# Patient Record
Sex: Female | Born: 1980
Health system: Southern US, Community
[De-identification: ages and names within clinical notes are randomized; demographics above are authoritative.]

## PROBLEM LIST (undated history)

## (undated) DIAGNOSIS — Z789 Other specified health status: Secondary | ICD-10-CM

---
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
## 2004-09-12 ENCOUNTER — Emergency Department (HOSPITAL_COMMUNITY): Admission: EM | Admit: 2004-09-12 | Discharge: 2004-09-12 | Payer: Self-pay | Admitting: Emergency Medicine

## 2005-05-31 ENCOUNTER — Other Ambulatory Visit: Admission: RE | Admit: 2005-05-31 | Discharge: 2005-05-31 | Payer: Self-pay | Admitting: Obstetrics and Gynecology

## 2006-06-03 IMAGING — CT CT HEAD W/O CM
1 series · 16 of 30 positions shown, 20 images · non-contrast
Comparison: None.

CLINICAL DATA: Three hours history of occipital headaches and dizziness.

CRANIAL CT WITHOUT CONTRAST  09/12/2004:
TECHNIQUE: 5 mm axial images were obtained from the skull base through the brain
to the vertex.

[Series 2: head_seq 4.5 h42s st · axial · 0.43mm/px · z∈[+1300,+1426]mm · 16 of 32 slices shown, 20 images]
[im 2/32  brain]
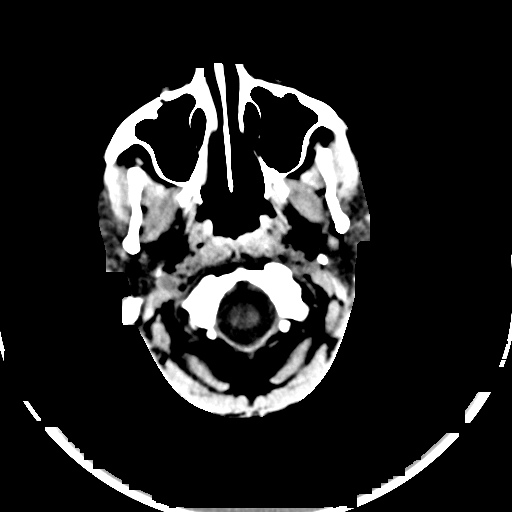
[im 2/32  bone]
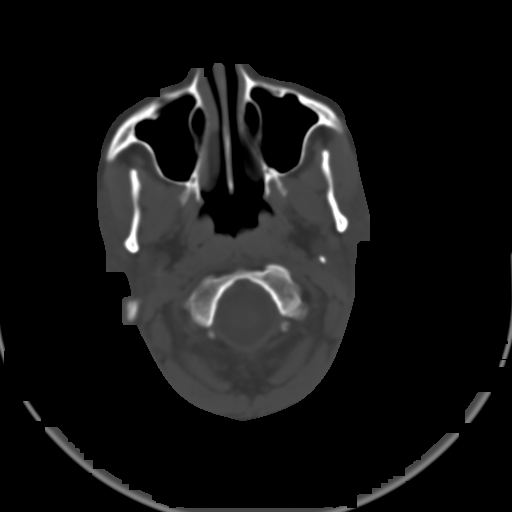
[im 4/32  brain]
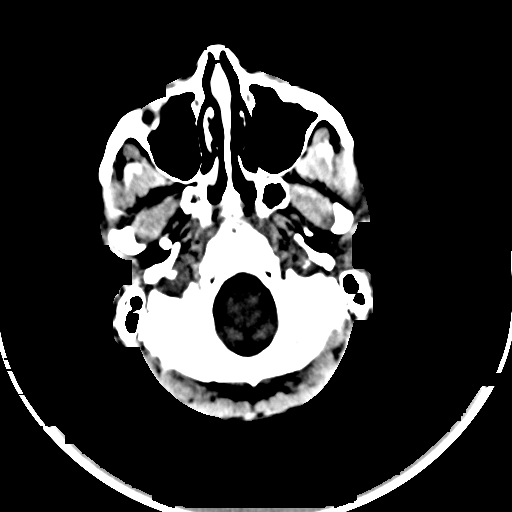
[im 6/32  brain]
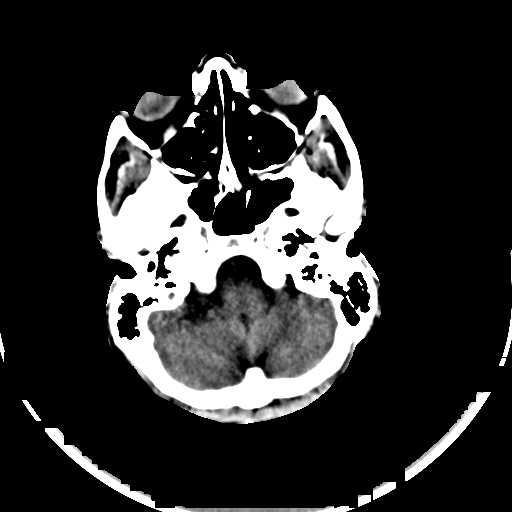
[im 8/32  brain]
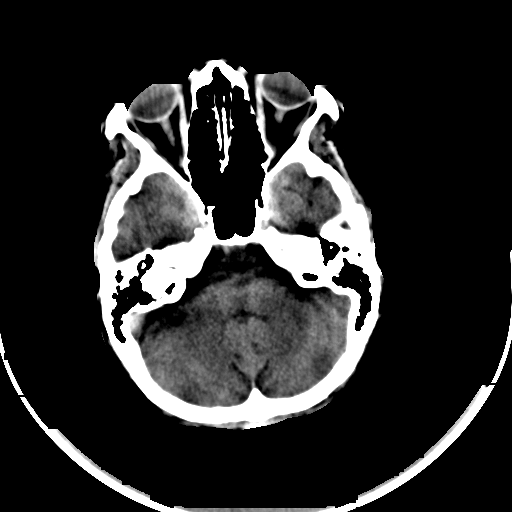
[im 9/32  brain]
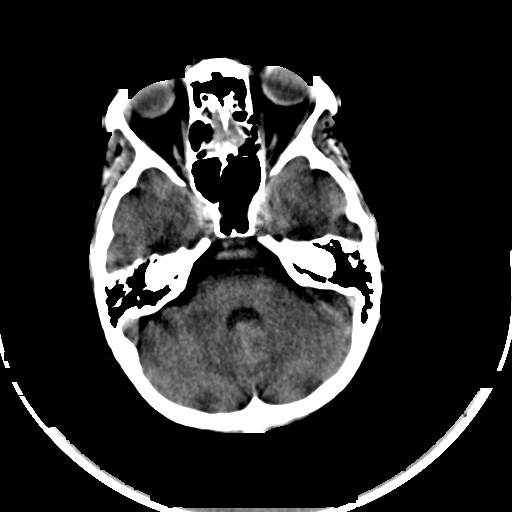
[im 9/32  bone]
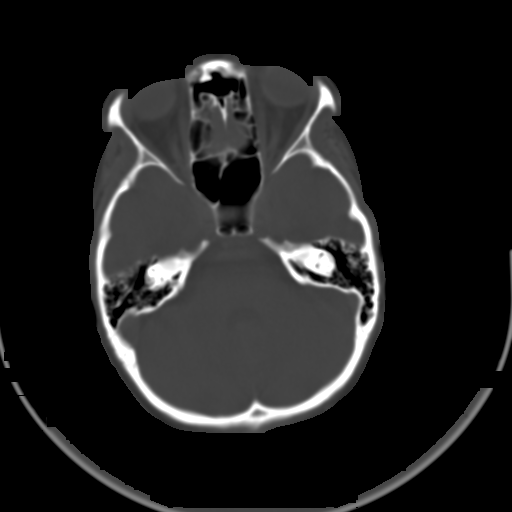
[im 11/32  brain]
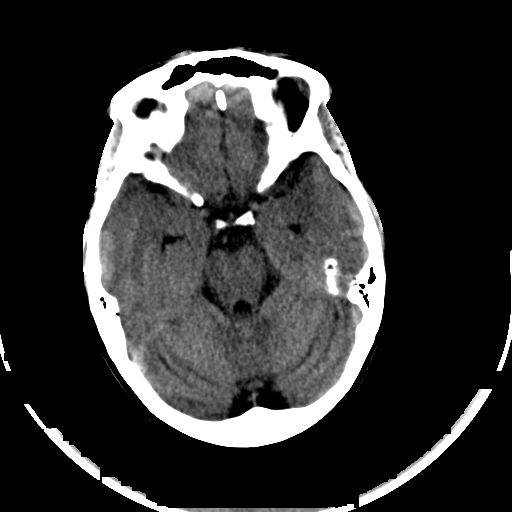
[im 13/32  brain]
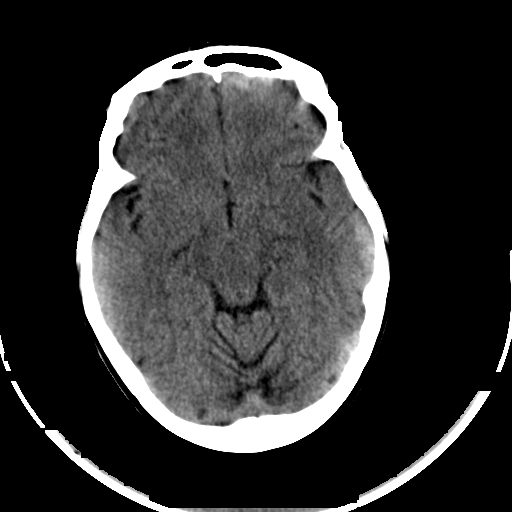
[im 15/32  brain]
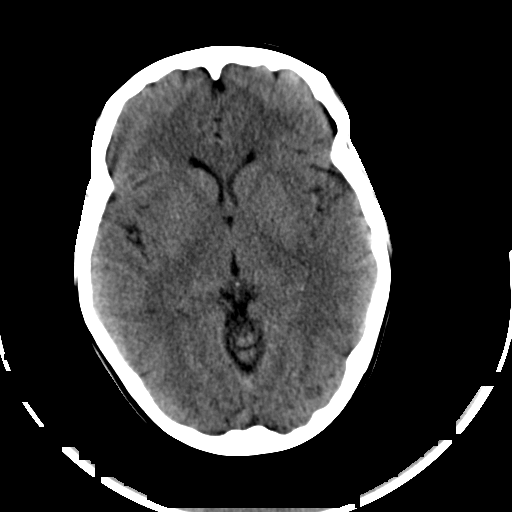
[im 17/32  brain]
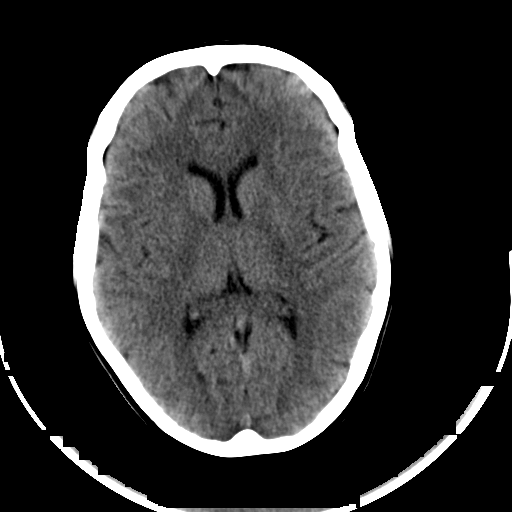
[im 17/32  bone]
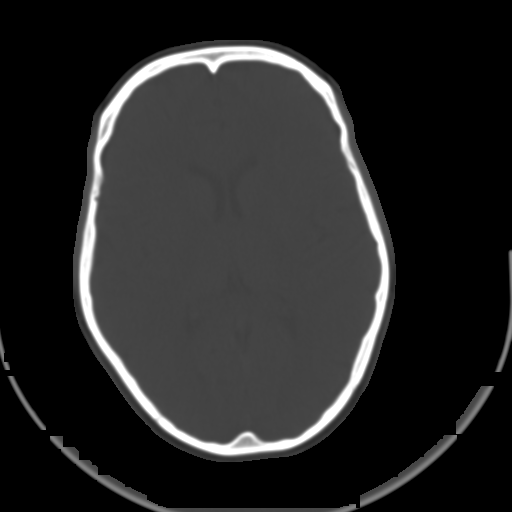
[im 19/32  brain]
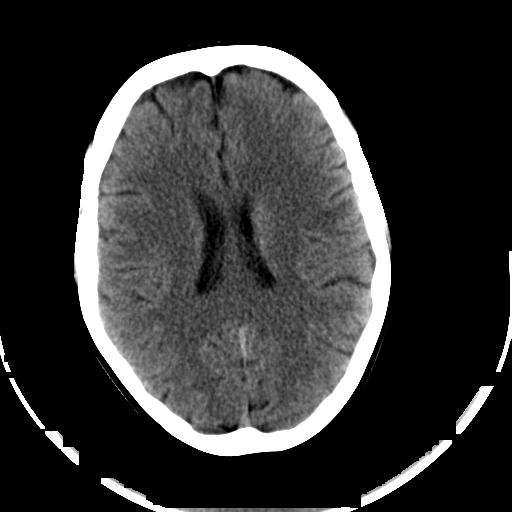
[im 21/32  brain]
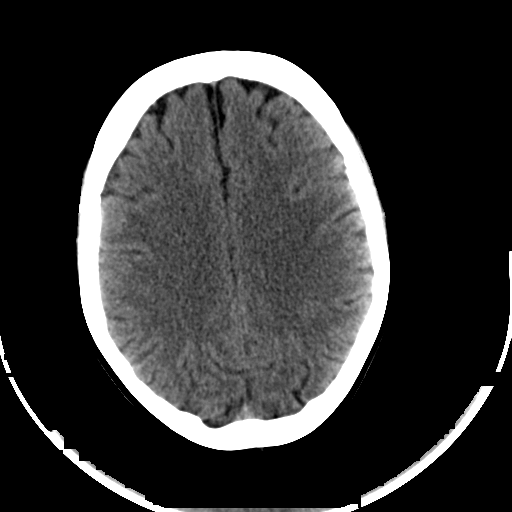
[im 23/32  brain]
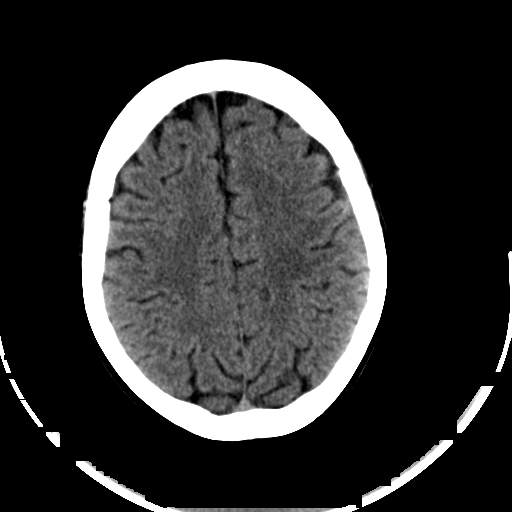
[im 24/32  brain]
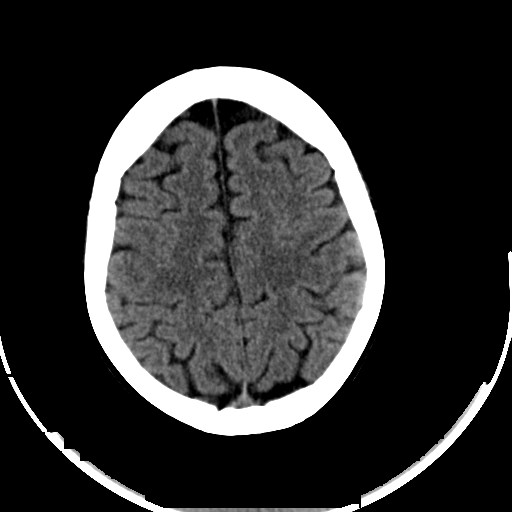
[im 24/32  bone]
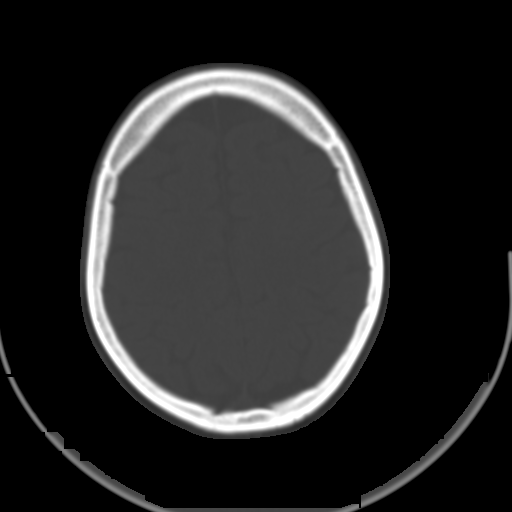
[im 26/32  brain]
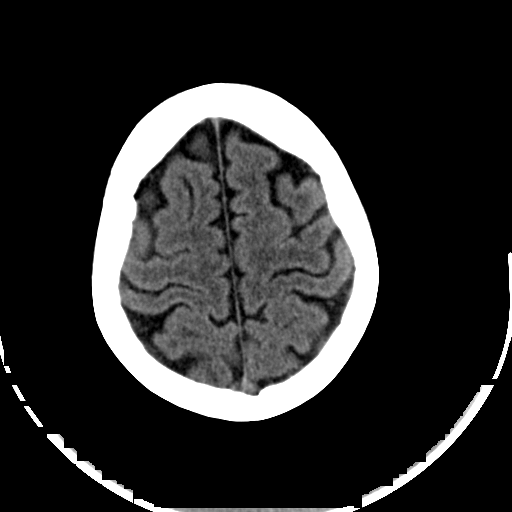
[im 28/32  brain]
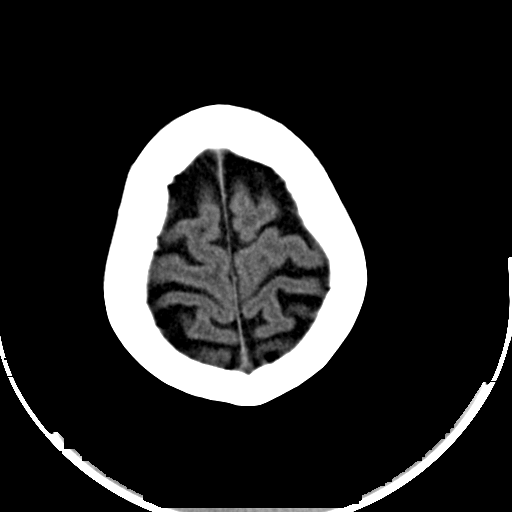
[im 30/32  brain]
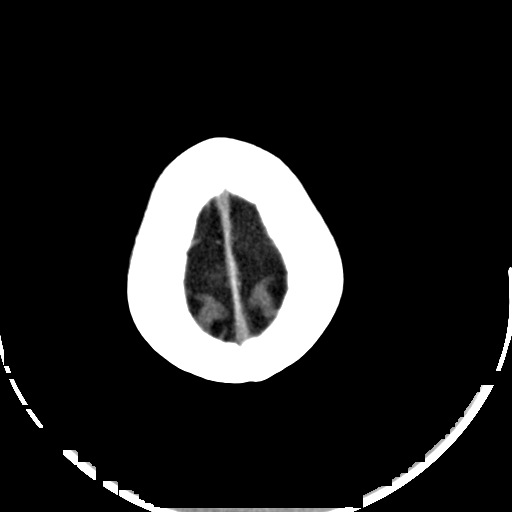

[16 of 30 positions shown; findings below may reference images not displayed]

FINDINGS: The ventricular system is normal in size and appearance for age. There
is no mass-effect or midline shift. There is no hemorrhage or hematoma. No
extra-axial fluid collections are identified. I see no focal brain parenchymal
abnormalities. 

The bone window images demonstrate no osseous abnormalities involving the skull.
The visualized paranasal sinuses and the mastoid air cells appear well aerated.
IMPRESSION: Normal unenhanced cranial CT.

## 2008-03-24 ENCOUNTER — Emergency Department (HOSPITAL_COMMUNITY): Admission: EM | Admit: 2008-03-24 | Discharge: 2008-03-24 | Payer: Self-pay | Admitting: Emergency Medicine

## 2009-02-14 HISTORY — PX: OTHER SURGICAL HISTORY: SHX169

## 2009-04-02 DIAGNOSIS — R87610 Atypical squamous cells of undetermined significance on cytologic smear of cervix (ASC-US): Secondary | ICD-10-CM | POA: Insufficient documentation

## 2009-10-01 ENCOUNTER — Inpatient Hospital Stay (HOSPITAL_COMMUNITY): Admission: AD | Admit: 2009-10-01 | Discharge: 2009-10-01 | Payer: Self-pay | Admitting: Obstetrics and Gynecology

## 2009-10-02 ENCOUNTER — Inpatient Hospital Stay (HOSPITAL_COMMUNITY): Admission: AD | Admit: 2009-10-02 | Discharge: 2009-10-02 | Payer: Self-pay | Admitting: Obstetrics and Gynecology

## 2009-10-02 ENCOUNTER — Ambulatory Visit: Payer: Self-pay | Admitting: Obstetrics and Gynecology

## 2009-10-11 ENCOUNTER — Inpatient Hospital Stay (HOSPITAL_COMMUNITY): Admission: AD | Admit: 2009-10-11 | Discharge: 2009-10-14 | Payer: Self-pay | Admitting: Obstetrics and Gynecology

## 2010-04-30 LAB — CBC
HCT: 42.7 % (ref 36.0–46.0)
Hemoglobin: 11.4 g/dL — ABNORMAL LOW (ref 12.0–15.0)
Hemoglobin: 14.2 g/dL (ref 12.0–15.0)
Hemoglobin: 15 g/dL (ref 12.0–15.0)
MCH: 32.4 pg (ref 26.0–34.0)
MCH: 32.6 pg (ref 26.0–34.0)
MCH: 33.3 pg (ref 26.0–34.0)
MCHC: 33.4 g/dL (ref 30.0–36.0)
MCHC: 34.2 g/dL (ref 30.0–36.0)
MCV: 95.2 fL (ref 78.0–100.0)
MCV: 96.1 fL (ref 78.0–100.0)
Platelets: 242 10*3/uL (ref 150–400)
Platelets: 256 10*3/uL (ref 150–400)
RBC: 3.42 MIL/uL — ABNORMAL LOW (ref 3.87–5.11)
RBC: 4.36 MIL/uL (ref 3.87–5.11)
RBC: 4.63 MIL/uL (ref 3.87–5.11)
RDW: 12.7 % (ref 11.5–15.5)
RDW: 13 % (ref 11.5–15.5)
WBC: 15.4 10*3/uL — ABNORMAL HIGH (ref 4.0–10.5)
WBC: 9.4 10*3/uL (ref 4.0–10.5)

## 2010-04-30 LAB — COMPREHENSIVE METABOLIC PANEL
ALT: 24 U/L (ref 0–35)
ALT: 29 U/L (ref 0–35)
AST: 32 U/L (ref 0–37)
AST: 36 U/L (ref 0–37)
Albumin: 2.1 g/dL — ABNORMAL LOW (ref 3.5–5.2)
Albumin: 3.4 g/dL — ABNORMAL LOW (ref 3.5–5.2)
Alkaline Phosphatase: 113 U/L (ref 39–117)
Alkaline Phosphatase: 169 U/L — ABNORMAL HIGH (ref 39–117)
BUN: 10 mg/dL (ref 6–23)
Calcium: 8.2 mg/dL — ABNORMAL LOW (ref 8.4–10.5)
Calcium: 9.2 mg/dL (ref 8.4–10.5)
Calcium: 9.3 mg/dL (ref 8.4–10.5)
Chloride: 107 mEq/L (ref 96–112)
Creatinine, Ser: 0.75 mg/dL (ref 0.4–1.2)
Creatinine, Ser: 0.8 mg/dL (ref 0.4–1.2)
GFR calc non Af Amer: >60 mL/min (ref >60)
GFR calc non Af Amer: >60 mL/min (ref >60)
Glucose, Bld: 82 mg/dL (ref 70–99)
Sodium: 134 mEq/L — ABNORMAL LOW (ref 135–145)
Sodium: 137 mEq/L (ref 135–145)
Total Bilirubin: 0.3 mg/dL (ref 0.3–1.2)
Total Bilirubin: 0.6 mg/dL (ref 0.3–1.2)
Total Protein: 4.3 g/dL — ABNORMAL LOW (ref 6.0–8.3)
Total Protein: 6.7 g/dL (ref 6.0–8.3)

## 2010-04-30 LAB — DIFFERENTIAL
Lymphocytes Relative: 10 % — ABNORMAL LOW (ref 12–46)
Monocytes Relative: 2 % — ABNORMAL LOW (ref 3–12)
Neutro Abs: 13.5 10*3/uL — ABNORMAL HIGH (ref 1.7–7.7)

## 2010-04-30 LAB — URINALYSIS, ROUTINE W REFLEX MICROSCOPIC
Hgb urine dipstick: NEGATIVE
pH: 6.5 (ref 5.0–8.0)

## 2010-04-30 LAB — URIC ACID
Uric Acid, Serum: 4.4 mg/dL (ref 2.4–7.0)
Uric Acid, Serum: 5.2 mg/dL (ref 2.4–7.0)
Uric Acid, Serum: 5.4 mg/dL (ref 2.4–7.0)

## 2010-12-27 ENCOUNTER — Emergency Department (HOSPITAL_COMMUNITY)
Admission: EM | Admit: 2010-12-27 | Discharge: 2010-12-27 | Disposition: A | Discharge status: Home / Self Care | Payer: 59 | Source: Home / Self Care | Attending: Emergency Medicine | Admitting: Emergency Medicine

## 2010-12-27 ENCOUNTER — Encounter (HOSPITAL_COMMUNITY): Payer: Self-pay

## 2010-12-27 DIAGNOSIS — J069 Acute upper respiratory infection, unspecified: Secondary | ICD-10-CM

## 2010-12-27 DIAGNOSIS — J329 Chronic sinusitis, unspecified: Secondary | ICD-10-CM

## 2010-12-27 MED ORDER — AMOXICILLIN-POT CLAVULANATE 500-125 MG PO TABS: 1.0000 | ORAL_TABLET | Freq: Three times a day (TID) | ORAL | Status: AC

## 2010-12-27 NOTE — ED Notes (Signed)
sinus pressure, congestion, with scratchy throat. yellowish-greenish mucous.  started saturday. gotten worse since then.  pt. is still breast-feeding, only takes allegra.

## 2010-12-27 NOTE — ED Provider Notes (Addendum)
History     CSN: [id number] Arrival date & time: 12/27/2010 12:51 PM   First MD Initiated Contact with Patient 12/27/10 1231      Chief Complaint  Patient presents with  . Sinusitis    sinus pressure, congestion, with scratchy throat. yellowish-greenish mucous.  started saturday. gotten worse since then.  pt. is still breast-feeding, only takes allegra.     (Consider location/radiation/quality/duration/timing/severity/associated sxs/prior treatment) HPI Comments: Now with pressure of my sinuses, and green stuff coming out of my nose, with sinus pressure, fevers and a sore throat  Patient is a 30 y.o. female presenting with sinusitis. The history is provided by the patient.  Sinusitis  The current episode started more than 2 days ago. The problem has not changed since onset.The maximum temperature recorded prior to her arrival was 100 to 100.9 F. The pain is at a severity of 4/10. The pain is moderate. The pain has been constant since onset. Associated symptoms include congestion, ear pain, sinus pressure, sore throat and cough. Pertinent negatives include no shortness of breath. She has tried nothing for the symptoms.    History reviewed. No pertinent past medical history.  Past Surgical History  Procedure Date  . Cesarian 2011    Family History  Problem Relation Age of Onset  . Hypertension Father     History  Substance Use Topics  . Smoking status: Never Smoker   . Smokeless tobacco: Never Used  . Alcohol Use: No    OB History    Grav Para Term Preterm Abortions TAB SAB Ect Mult Living                  Review of Systems  Constitutional: Negative.   HENT: Positive for ear pain, congestion, sore throat, rhinorrhea, postnasal drip and sinus pressure.   Eyes: Negative.   Respiratory: Positive for cough. Negative for shortness of breath, wheezing and stridor.   All other systems reviewed and are negative.    Allergies  Doxycycline and Sulfa antibiotics  Home  Medications   Current Outpatient Rx  Name Route Sig Dispense Refill  . LORATADINE 10 MG PO TABS Oral Take 10 mg by mouth daily.      . AMOXICILLIN-POT CLAVULANATE 500-125 MG PO TABS Oral Take 1 tablet (500 mg total) by mouth every 8 (eight) hours. 21 tablet 0    BP 131/72  Pulse 76  Temp(Src) 98.6 F (37 C) (Oral)  Resp 100  SpO2 100%  Physical Exam  Vitals reviewed. Constitutional: She appears well-developed and well-nourished.  HENT:  Head: Normocephalic.  Right Ear: Hearing, tympanic membrane, external ear and ear canal normal.  Left Ear: Hearing, tympanic membrane, external ear and ear canal normal.  Nose: Mucosal edema and rhinorrhea present.  Mouth/Throat: Mucous membranes are normal. Posterior oropharyngeal edema and posterior oropharyngeal erythema present. No tonsillar abscesses.  Eyes: Pupils are equal, round, and reactive to light.  Neck: Normal range of motion.  Pulmonary/Chest: Effort normal and breath sounds normal.  Neurological: She is alert.  Skin: Skin is warm.    ED Course  Procedures (including critical care time)  Labs Reviewed - No data to display No results found.   1. Acute URI   2. Sinusitis       MDM  Infant at home with URI        Jimmie Molly, MD 12/27/10 1534  Jimmie Molly, MD 12/27/10 1536

## 2011-02-18 ENCOUNTER — Ambulatory Visit
Admission: RE | Admit: 2011-02-18 | Discharge: 2011-02-18 | Disposition: A | Discharge status: Home / Self Care | Payer: 59 | Source: Ambulatory Visit | Attending: Family Medicine | Admitting: Family Medicine

## 2011-02-18 ENCOUNTER — Other Ambulatory Visit: Payer: Self-pay | Admitting: Family Medicine

## 2011-02-18 DIAGNOSIS — M79609 Pain in unspecified limb: Secondary | ICD-10-CM

## 2013-02-05 LAB — OB RESULTS CONSOLE GC/CHLAMYDIA
CHLAMYDIA, DNA PROBE: NEGATIVE
GC PROBE AMP, GENITAL: NEGATIVE

## 2013-02-05 LAB — OB RESULTS CONSOLE RPR: RPR: NONREACTIVE

## 2013-02-05 LAB — OB RESULTS CONSOLE HIV ANTIBODY (ROUTINE TESTING): HIV: NONREACTIVE

## 2013-02-05 LAB — OB RESULTS CONSOLE HEPATITIS B SURFACE ANTIGEN: Hepatitis B Surface Ag: NEGATIVE

## 2013-02-05 LAB — OB RESULTS CONSOLE RUBELLA ANTIBODY, IGM: RUBELLA: IMMUNE

## 2013-02-05 LAB — OB RESULTS CONSOLE ANTIBODY SCREEN: Antibody Screen: NEGATIVE

## 2013-02-05 LAB — OB RESULTS CONSOLE ABO/RH: RH Type: POSITIVE

## 2013-08-19 LAB — OB RESULTS CONSOLE GBS: STREP GROUP B AG: NEGATIVE

## 2013-09-09 ENCOUNTER — Encounter (HOSPITAL_COMMUNITY): Payer: Self-pay | Admitting: Pharmacist

## 2013-09-11 ENCOUNTER — Inpatient Hospital Stay (HOSPITAL_COMMUNITY)
Admission: AD | Admit: 2013-09-11 | Discharge: 2013-09-13 | DRG: 775 | Disposition: A | Discharge status: Home / Self Care | Payer: 59 | Source: Ambulatory Visit | Attending: Obstetrics and Gynecology | Admitting: Obstetrics and Gynecology

## 2013-09-11 ENCOUNTER — Inpatient Hospital Stay (HOSPITAL_COMMUNITY): Payer: 59 | Admitting: Anesthesiology

## 2013-09-11 ENCOUNTER — Encounter (HOSPITAL_COMMUNITY): Payer: 59 | Admitting: Anesthesiology

## 2013-09-11 ENCOUNTER — Encounter (HOSPITAL_COMMUNITY): Payer: Self-pay | Admitting: *Deleted

## 2013-09-11 DIAGNOSIS — O212 Late vomiting of pregnancy: Secondary | ICD-10-CM | POA: Diagnosis present

## 2013-09-11 DIAGNOSIS — O34219 Maternal care for unspecified type scar from previous cesarean delivery: Secondary | ICD-10-CM | POA: Diagnosis present

## 2013-09-11 DIAGNOSIS — Z8249 Family history of ischemic heart disease and other diseases of the circulatory system: Secondary | ICD-10-CM

## 2013-09-11 HISTORY — DX: Other specified health status: Z78.9

## 2013-09-11 LAB — CBC
HEMATOCRIT: 36.3 % (ref 36.0–46.0)
Hemoglobin: 12.3 g/dL (ref 12.0–15.0)
MCH: 31.3 pg (ref 26.0–34.0)
MCHC: 33.9 g/dL (ref 30.0–36.0)
MCV: 92.4 fL (ref 78.0–100.0)
Platelets: 258 10*3/uL (ref 150–400)
RBC: 3.93 MIL/uL (ref 3.87–5.11)
RDW: 13 % (ref 11.5–15.5)
WBC: 7.1 10*3/uL (ref 4.0–10.5)

## 2013-09-11 LAB — TYPE AND SCREEN
ABO/RH(D): A POS
ANTIBODY SCREEN: NEGATIVE

## 2013-09-11 MED ORDER — FLEET ENEMA 7-19 GM/118ML RE ENEM: 1.0000 | ENEMA | RECTAL | Status: DC | PRN

## 2013-09-11 MED ORDER — ONDANSETRON HCL 4 MG/2ML IJ SOLN
4.0000 mg | Freq: Four times a day (QID) | INTRAMUSCULAR | Status: DC | PRN
  Administered 2013-09-11 – 2013-09-12 (×2): 4 mg via INTRAVENOUS
  Filled 2013-09-11 (×3): qty 2

## 2013-09-11 MED ORDER — EPHEDRINE 5 MG/ML INJ
10.0000 mg | INTRAVENOUS | Status: DC | PRN
  Filled 2013-09-11: qty 2

## 2013-09-11 MED ORDER — ACETAMINOPHEN 325 MG PO TABS: 650.0000 mg | ORAL_TABLET | ORAL | Status: DC | PRN

## 2013-09-11 MED ORDER — FENTANYL 2.5 MCG/ML BUPIVACAINE 1/10 % EPIDURAL INFUSION (WH - ANES)
INTRAMUSCULAR | Status: DC | PRN
  Administered 2013-09-11: 14 mL/h via EPIDURAL

## 2013-09-11 MED ORDER — OXYTOCIN 40 UNITS IN LACTATED RINGERS INFUSION - SIMPLE MED
62.5000 mL/h | INTRAVENOUS | Status: DC
  Administered 2013-09-12: 999 mL/h via INTRAVENOUS
  Filled 2013-09-11: qty 1000

## 2013-09-11 MED ORDER — OXYTOCIN BOLUS FROM INFUSION: 500.0000 mL | INTRAVENOUS | Status: DC

## 2013-09-11 MED ORDER — CITRIC ACID-SODIUM CITRATE 334-500 MG/5ML PO SOLN: 30.0000 mL | ORAL | Status: DC | PRN

## 2013-09-11 MED ORDER — PHENYLEPHRINE 40 MCG/ML (10ML) SYRINGE FOR IV PUSH (FOR BLOOD PRESSURE SUPPORT)
PREFILLED_SYRINGE | INTRAVENOUS | Status: DC
Start: 2013-09-11 — End: 2013-09-12
  Filled 2013-09-11: qty 10

## 2013-09-11 MED ORDER — DIPHENHYDRAMINE HCL 50 MG/ML IJ SOLN: 12.5000 mg | INTRAMUSCULAR | Status: DC | PRN

## 2013-09-11 MED ORDER — FENTANYL 2.5 MCG/ML BUPIVACAINE 1/10 % EPIDURAL INFUSION (WH - ANES)
INTRAMUSCULAR | Status: AC
  Filled 2013-09-11: qty 125

## 2013-09-11 MED ORDER — LACTATED RINGERS IV SOLN
500.0000 mL | INTRAVENOUS | Status: DC | PRN
  Administered 2013-09-11: 500 mL via INTRAVENOUS

## 2013-09-11 MED ORDER — LIDOCAINE HCL (PF) 1 % IJ SOLN
INTRAMUSCULAR | Status: DC | PRN
  Administered 2013-09-11: 3 mL
  Administered 2013-09-11 (×2): 5 mL

## 2013-09-11 MED ORDER — LACTATED RINGERS IV SOLN
INTRAVENOUS | Status: DC
  Administered 2013-09-11 – 2013-09-12 (×3): via INTRAVENOUS

## 2013-09-11 MED ORDER — IBUPROFEN 600 MG PO TABS: 600.0000 mg | ORAL_TABLET | Freq: Four times a day (QID) | ORAL | Status: DC | PRN

## 2013-09-11 MED ORDER — FENTANYL 2.5 MCG/ML BUPIVACAINE 1/10 % EPIDURAL INFUSION (WH - ANES)
14.0000 mL/h | INTRAMUSCULAR | Status: DC | PRN
  Administered 2013-09-12: 14 mL/h via EPIDURAL
  Filled 2013-09-11: qty 125

## 2013-09-11 MED ORDER — LACTATED RINGERS IV SOLN
500.0000 mL | Freq: Once | INTRAVENOUS | Status: AC
  Administered 2013-09-11: 500 mL via INTRAVENOUS

## 2013-09-11 MED ORDER — LIDOCAINE HCL (PF) 1 % IJ SOLN
30.0000 mL | INTRAMUSCULAR | Status: DC | PRN
  Filled 2013-09-11: qty 30

## 2013-09-11 MED ORDER — PHENYLEPHRINE 40 MCG/ML (10ML) SYRINGE FOR IV PUSH (FOR BLOOD PRESSURE SUPPORT)
80.0000 ug | PREFILLED_SYRINGE | INTRAVENOUS | Status: DC | PRN
  Filled 2013-09-11: qty 2

## 2013-09-11 MED ORDER — OXYCODONE-ACETAMINOPHEN 5-325 MG PO TABS: 1.0000 | ORAL_TABLET | ORAL | Status: DC | PRN

## 2013-09-11 NOTE — Anesthesia Procedure Notes (Signed)
Epidural Patient location during procedure: OB  Staffing Anesthesiologist: Dmoni Fortson Performed by: anesthesiologist   Preanesthetic Checklist Completed: patient identified, site marked, surgical consent, pre-op evaluation, timeout performed, IV checked, risks and benefits discussed and monitors and equipment checked  Epidural Patient position: sitting Prep: ChloraPrep Patient monitoring: heart rate, continuous pulse ox and blood pressure Approach: right paramedian Location: L3-L4 Injection technique: LOR saline  Needle:  Needle type: Tuohy  Needle gauge: 17 G Needle length: 9 cm and 9 Needle insertion depth: 4 cm Catheter type: closed end flexible Catheter size: 20 Guage Catheter at skin depth: 8 cm Test dose: negative  Assessment Events: blood not aspirated, injection not painful, no injection resistance, negative IV test and no paresthesia  Additional Notes   Patient tolerated the insertion well without complications.   

## 2013-09-11 NOTE — Anesthesia Preprocedure Evaluation (Signed)

## 2013-09-12 ENCOUNTER — Encounter (HOSPITAL_COMMUNITY): Payer: Self-pay | Admitting: *Deleted

## 2013-09-12 DIAGNOSIS — O34219 Maternal care for unspecified type scar from previous cesarean delivery: Secondary | ICD-10-CM | POA: Diagnosis present

## 2013-09-12 LAB — RPR

## 2013-09-12 LAB — ABO/RH: ABO/RH(D): A POS

## 2013-09-12 MED ORDER — IBUPROFEN 600 MG PO TABS
600.0000 mg | ORAL_TABLET | Freq: Four times a day (QID) | ORAL | Status: DC
  Administered 2013-09-12 – 2013-09-13 (×4): 600 mg via ORAL
  Filled 2013-09-12 (×5): qty 1

## 2013-09-12 MED ORDER — TETANUS-DIPHTH-ACELL PERTUSSIS 5-2.5-18.5 LF-MCG/0.5 IM SUSP: 0.5000 mL | Freq: Once | INTRAMUSCULAR | Status: DC

## 2013-09-12 MED ORDER — MEASLES, MUMPS & RUBELLA VAC ~~LOC~~ INJ: 0.5000 mL | INJECTION | Freq: Once | SUBCUTANEOUS | Status: DC

## 2013-09-12 MED ORDER — DIBUCAINE 1 % RE OINT: 1.0000 "application " | TOPICAL_OINTMENT | RECTAL | Status: DC | PRN

## 2013-09-12 MED ORDER — SIMETHICONE 80 MG PO CHEW: 80.0000 mg | CHEWABLE_TABLET | ORAL | Status: DC | PRN

## 2013-09-12 MED ORDER — PRENATAL MULTIVITAMIN CH
1.0000 | ORAL_TABLET | Freq: Every day | ORAL | Status: DC
  Administered 2013-09-13: 1 via ORAL
  Filled 2013-09-12: qty 1

## 2013-09-12 MED ORDER — ONDANSETRON HCL 4 MG/2ML IJ SOLN: 4.0000 mg | INTRAMUSCULAR | Status: DC | PRN

## 2013-09-12 MED ORDER — MEDROXYPROGESTERONE ACETATE 150 MG/ML IM SUSP: 150.0000 mg | INTRAMUSCULAR | Status: DC | PRN

## 2013-09-12 MED ORDER — ONDANSETRON HCL 4 MG PO TABS: 4.0000 mg | ORAL_TABLET | ORAL | Status: DC | PRN

## 2013-09-12 MED ORDER — SCOPOLAMINE 1 MG/3DAYS TD PT72
1.0000 | MEDICATED_PATCH | TRANSDERMAL | Status: DC
  Administered 2013-09-12: 1.5 mg via TRANSDERMAL
  Filled 2013-09-12: qty 1

## 2013-09-12 MED ORDER — ONDANSETRON HCL 4 MG/2ML IJ SOLN
4.0000 mg | Freq: Once | INTRAMUSCULAR | Status: AC
  Administered 2013-09-12: 4 mg via INTRAVENOUS

## 2013-09-12 MED ORDER — LANOLIN HYDROUS EX OINT: TOPICAL_OINTMENT | CUTANEOUS | Status: DC | PRN

## 2013-09-12 MED ORDER — BENZOCAINE-MENTHOL 20-0.5 % EX AERO
1.0000 "application " | INHALATION_SPRAY | CUTANEOUS | Status: DC | PRN
  Administered 2013-09-12 – 2013-09-13 (×2): 1 via TOPICAL
  Filled 2013-09-12 (×2): qty 56

## 2013-09-12 MED ORDER — OXYCODONE-ACETAMINOPHEN 5-325 MG PO TABS
1.0000 | ORAL_TABLET | ORAL | Status: DC | PRN
  Administered 2013-09-13: 1 via ORAL
  Filled 2013-09-12: qty 1

## 2013-09-12 MED ORDER — SENNOSIDES-DOCUSATE SODIUM 8.6-50 MG PO TABS
2.0000 | ORAL_TABLET | ORAL | Status: DC
  Administered 2013-09-13: 2 via ORAL
  Filled 2013-09-12: qty 2

## 2013-09-12 MED ORDER — DIPHENHYDRAMINE HCL 25 MG PO CAPS: 25.0000 mg | ORAL_CAPSULE | Freq: Four times a day (QID) | ORAL | Status: DC | PRN

## 2013-09-12 MED ORDER — WITCH HAZEL-GLYCERIN EX PADS
1.0000 | MEDICATED_PAD | CUTANEOUS | Status: DC | PRN
Start: 2013-09-12 — End: 2013-09-13

## 2013-09-12 NOTE — Progress Notes (Addendum)
Pt with continued N/V despite IV zofran.   Still pushing with good effort despite these symptoms but feels weak Discussed vacuum assist, risks and alternatives reviewed, informed consent Bell vacuum applied and gentle traction with next push to achieve delivery of vertex Vacuum removed & Shoulders and body followed with push  infant placed on moms abdomen, Apgars 8,9 Placenta delivered spontaneous w/ 3VC.   2nd degree lac repaired w/ 3-0 vicryl rapide.  Fundus firm.  EBL 250cc .  Mom and baby stable, couplet care (VBAC)

## 2013-09-12 NOTE — Anesthesia Postprocedure Evaluation (Signed)
  Anesthesia Post-op Note  Patient: Wendy Perez  Procedure(s) Performed: * No procedures listed *  Patient Location: PACU and Mother/Baby  Anesthesia Type:Epidural  Level of Consciousness: awake, alert , oriented and patient cooperative  Airway and Oxygen Therapy: Patient Spontanous Breathing  Post-op Pain: none  Post-op Assessment: Post-op Vital signs reviewed, No signs of Nausea or vomiting, Adequate PO intake, No headache, No backache, No residual numbness and No residual motor weakness  Post-op Vital Signs: Reviewed and stable  Last Vitals:  Filed Vitals:   09/12/13 1545  BP: 123/97  Pulse: 59  Temp: 36.6 C  Resp: 18    Complications: No apparent anesthesia complications

## 2013-09-12 NOTE — H&P (Signed)
Wendy MemsKristen E Cornforth is a 33 y.o. female presenting at 6738.5 with onset of labor. Prior cesarean section for failure to descend.  Desires trial of labor.  GBS negative. Maternal Medical History:  Reason for admission: Contractions.   Contractions: Onset was 3-5 hours ago.   Frequency: regular.   Perceived severity is moderate.    Fetal activity: Perceived fetal activity is normal.    Prenatal complications: no prenatal complications Prenatal Complications - Diabetes: none.    OB History   Grav Para Term Preterm Abortions TAB SAB Ect Mult Living   3 1 1  0 1 0 1 0 0 1     Past Medical History  Diagnosis Date  . Medical history non-contributory    Past Surgical History  Procedure Laterality Date  . Cesarian  2011  . Cesarean section     Family History: family history includes Hypertension in her father. Social History:  reports that she has never smoked. She has never used smokeless tobacco. She reports that she does not drink alcohol or use illicit drugs.   Prenatal Transfer Tool  Maternal Diabetes: No Genetic Screening: Normal Maternal Ultrasounds/Referrals: Normal Fetal Ultrasounds or other Referrals:  None Maternal Substance Abuse:  No Significant Maternal Medications:  None Significant Maternal Lab Results:  None Other Comments:  None  ROS  Dilation: 10 Effacement (%): 100 Station: 0 Exam by:: [email or web address],rnc Blood pressure 128/112, pulse 74, temperature 98.4 F (36.9 C), temperature source Oral, resp. rate 18, height 5\' 2"  (1.575 m), weight 63.504 kg (140 lb), SpO2 100.00%. Maternal Exam:  Uterine Assessment: Contraction strength is firm.  Contraction frequency is regular.   Abdomen: Patient reports no abdominal tenderness. Surgical scars: low transverse.   Fundal height is c/w dates.   Estimated fetal weight is 7.   Fetal presentation: vertex  Introitus: Amniotic fluid character: clear.  Pelvis: adequate for delivery.   Cervix: Completely dilated vertex at  +1  Fetal Exam Fetal State Assessment: Category I - tracings are normal.     Physical Exam  Prenatal labs: ABO, Rh: --/--/A POS, A POS (07/29 1920) Antibody: NEG (07/29 1920) Rubella: Immune (12/23 0000) RPR: NON REAC (07/29 1920)  HBsAg: Negative (12/23 0000)  HIV: Non-reactive (12/23 0000)  GBS: Negative (07/06 0000)   Assessment/Plan: IUP at 38.5 with onset of labor Prior cesarean section desire trial of labor Patient desires a trial of labor after previous low transverse cesarean section. The risk of uterine scar rupture have been discussed. The complications associated with this are as follows. He can have maternal hemorrhaging requiring transfusion with the risk of AIDS or hepatitis. Excessive bleeding could require hysterectomy. There can be fetal damage or death. This can occur from abruption cord entrapment. Patient has understanding potential risk and complications and wishes to proceed with trial of labor.   Wagner Tanzi S 09/12/2013, 5:43 AM

## 2013-09-13 LAB — CBC
HEMATOCRIT: 34.1 % — AB (ref 36.0–46.0)
Hemoglobin: 11.1 g/dL — ABNORMAL LOW (ref 12.0–15.0)
MCH: 30.2 pg (ref 26.0–34.0)
MCHC: 32.6 g/dL (ref 30.0–36.0)
MCV: 92.9 fL (ref 78.0–100.0)
PLATELETS: 223 10*3/uL (ref 150–400)
RBC: 3.67 MIL/uL — ABNORMAL LOW (ref 3.87–5.11)
RDW: 13.2 % (ref 11.5–15.5)
WBC: 10.6 10*3/uL — AB (ref 4.0–10.5)

## 2013-09-13 MED ORDER — OXYCODONE-ACETAMINOPHEN 5-325 MG PO TABS: 1.0000 | ORAL_TABLET | ORAL | Status: DC | PRN

## 2013-09-13 MED ORDER — IBUPROFEN 600 MG PO TABS: 600.0000 mg | ORAL_TABLET | Freq: Four times a day (QID) | ORAL | Status: DC

## 2013-09-13 NOTE — Progress Notes (Signed)
Post Partum Day 1 Subjective: no complaints, up ad lib, voiding and tolerating PO  Objective: Blood pressure 144/74, pulse 51, temperature 98.3 F (36.8 C), temperature source Oral, resp. rate 16, height 5\' 2"  (1.575 m), weight 140 lb (63.504 kg), SpO2 96.00%, unknown if currently breastfeeding.  Physical Exam:  General: alert and cooperative Lochia: appropriate Uterine Fundus: firm Incision: perineum intact DVT Evaluation: No evidence of DVT seen on physical exam. Negative Homan's sign. No cords or calf tenderness. No significant calf/ankle edema.   Recent Labs  09/11/13 1920 09/13/13 0540  HGB 12.3 11.1*  HCT 36.3 34.1*    Assessment/Plan: Discharge home   LOS: 2 days   Wendy Perez G 09/13/2013, 8:52 AM

## 2013-09-13 NOTE — Lactation Note (Signed)
This note was copied from the chart of Wendy Wendy Perez. Lactation Consultation Note  Patient Name: Wendy Perez NWGNF'AToday's Date: 09/13/2013 Reason for consult: Follow-up assessment Per mom baby is breast feeding well, right nipple has small blister, and the left at the base has a crack. Mom showed the LC the soreness, mom has been using the comfort gels , and LC instructed on the hand pump  And shells . Assessed mom breast tissue with permission and reviewed basics - prior ot latch breast massage, hand express, Pre-pump if needed, and reverse pressure due to areola edema, and latch with breast compressions until baby is in a consistent  Swallowing pattern and then intermittent during the feeding. Per mom also plans to obtain insurance pump [facility] today After discharge. LC reviewed sore nipple and engorgement prevention and tx , #27 flanges given to mom due to potential areola edema.  Areola edema noted today, massage , hand express, reverse pressure helped to enhance compressibility for a deeper latch. Mom requested another latch check , right breast, and plans to call. Mother informed of post-discharge support and given phone number to the lactation department, including services for phone call assistance;  out-patient appointments; and breastfeeding support group. List of other breastfeeding resources in the community given in the handout.  Encouraged mother to call for problems or concerns related to breastfeeding.     Maternal Data Formula Feeding for Exclusion: No Has patient been taught Hand Expression?: Yes Does the patient have breastfeeding experience prior to this delivery?: Yes  Feeding Feeding Type: Breast Fed Length of feed: 25 min (multiply swallows, increased with breast compressions )  LATCH Score/Interventions Latch: Grasps breast easily, tongue down, lips flanged, rhythmical sucking.  Audible Swallowing: Spontaneous and  intermittent Intervention(s): Skin to skin;Hand expression;Alternate breast massage  Type of Nipple: Everted at rest and after stimulation  Comfort (Breast/Nipple): Soft / non-tender     Hold (Positioning): Assistance needed to correctly position infant at breast and maintain latch. Intervention(s): Breastfeeding basics reviewed;Support Pillows;Position options;Skin to skin (worked on depth and positioning in [facility] cradle )  LATCH Score: 9  Lactation Tools Discussed/Used Tools: Shells;Pump;Comfort gels;Flanges Flange Size: 27 (due to areola edema ) Shell Type: Inverted Breast pump type: Manual WIC Program: No Pump Review: Setup, frequency, and cleaning Initiated by:: mai  Date initiated:: 09/13/13   Consult Status Consult Status: Follow-up Date: 09/13/13 Follow-up type: In-patient    Kathrin Greathouseorio, Nina Hoar Ann 09/13/2013, 10:20 AM

## 2013-09-13 NOTE — Discharge Summary (Signed)
Obstetric Discharge Summary Reason for Admission: onset of labor Prenatal Procedures: ultrasound Intrapartum Procedures: spontaneous vaginal delivery Postpartum Procedures: none Complications-Operative and Postpartum: 2 degree perineal laceration Hemoglobin  Date Value Ref Range Status  09/13/2013 11.1* 12.0 - 15.0 g/dL Final     HCT  Date Value Ref Range Status  09/13/2013 34.1* 36.0 - 46.0 % Final    Physical Exam:  General: alert and cooperative Lochia: appropriate Uterine Fundus: firm Incision: perineum intact DVT Evaluation: No evidence of DVT seen on physical exam. Negative Homan's sign. No cords or calf tenderness. No significant calf/ankle edema.  Discharge Diagnoses: Term Pregnancy-delivered  Discharge Information: Date: 09/13/2013 Activity: pelvic rest Diet: routine Medications: PNV, Ibuprofen and Percocet Condition: stable Instructions: refer to practice specific booklet Discharge to: home   Newborn Data: Live born female  Birth Weight: 6 lb 7.2 oz (2926 g) APGAR: 8, 9  Home with mother.  Persais Ethridge G 09/13/2013, 9:05 AM

## 2013-09-25 ENCOUNTER — Encounter (HOSPITAL_COMMUNITY): Admission: RE | Payer: Self-pay | Source: Ambulatory Visit

## 2013-09-25 ENCOUNTER — Inpatient Hospital Stay (HOSPITAL_COMMUNITY): Admission: RE | Admit: 2013-09-25 | Payer: 59 | Source: Ambulatory Visit | Admitting: Obstetrics and Gynecology

## 2013-09-25 SURGERY — Surgical Case
Anesthesia: Regional

## 2013-12-16 ENCOUNTER — Encounter (HOSPITAL_COMMUNITY): Payer: Self-pay | Admitting: *Deleted

## 2015-03-02 MED FILL — JUNEL 1-20 TABLET: 1-20 | 21 days supply | Qty: 21 | Fill #0

## 2015-04-29 DIAGNOSIS — Z01419 Encounter for gynecological examination (general) (routine) without abnormal findings: Secondary | ICD-10-CM | POA: Diagnosis not present

## 2015-04-29 DIAGNOSIS — B977 Papillomavirus as the cause of diseases classified elsewhere: Secondary | ICD-10-CM | POA: Insufficient documentation

## 2015-04-29 DIAGNOSIS — Z6824 Body mass index (BMI) 24.0-24.9, adult: Secondary | ICD-10-CM | POA: Diagnosis not present

## 2015-04-29 DIAGNOSIS — N939 Abnormal uterine and vaginal bleeding, unspecified: Secondary | ICD-10-CM | POA: Diagnosis not present

## 2015-04-29 DIAGNOSIS — Z3200 Encounter for pregnancy test, result unknown: Secondary | ICD-10-CM | POA: Diagnosis not present

## 2015-05-01 DIAGNOSIS — N912 Amenorrhea, unspecified: Secondary | ICD-10-CM | POA: Diagnosis not present

## 2015-05-08 DIAGNOSIS — O039 Complete or unspecified spontaneous abortion without complication: Secondary | ICD-10-CM | POA: Diagnosis not present

## 2015-05-14 DIAGNOSIS — Z3202 Encounter for pregnancy test, result negative: Secondary | ICD-10-CM | POA: Diagnosis not present

## 2015-05-14 DIAGNOSIS — Z30430 Encounter for insertion of intrauterine contraceptive device: Secondary | ICD-10-CM | POA: Diagnosis not present

## 2015-06-12 DIAGNOSIS — H5213 Myopia, bilateral: Secondary | ICD-10-CM | POA: Diagnosis not present

## 2015-06-29 DIAGNOSIS — R102 Pelvic and perineal pain: Secondary | ICD-10-CM | POA: Diagnosis not present

## 2015-06-29 DIAGNOSIS — Z30431 Encounter for routine checking of intrauterine contraceptive device: Secondary | ICD-10-CM | POA: Diagnosis not present

## 2015-08-11 DIAGNOSIS — N83299 Other ovarian cyst, unspecified side: Secondary | ICD-10-CM | POA: Diagnosis not present

## 2015-10-06 MED FILL — SERTRALINE HCL 50 MG TABLET: 50 | 30 days supply | Qty: 30 | Fill #0

## 2015-12-03 MED FILL — SERTRALINE HCL 50 MG TABLET: 50 | 30 days supply | Qty: 30 | Fill #1

## 2016-01-10 ENCOUNTER — Inpatient Hospital Stay (HOSPITAL_COMMUNITY): Payer: 59

## 2016-01-10 ENCOUNTER — Encounter (HOSPITAL_COMMUNITY): Payer: Self-pay

## 2016-01-10 ENCOUNTER — Inpatient Hospital Stay (HOSPITAL_COMMUNITY)
Admission: AD | Admit: 2016-01-10 | Discharge: 2016-01-10 | Disposition: A | Discharge status: Home / Self Care | Payer: 59 | Source: Ambulatory Visit | Attending: Obstetrics and Gynecology | Admitting: Obstetrics and Gynecology

## 2016-01-10 DIAGNOSIS — Z79899 Other long term (current) drug therapy: Secondary | ICD-10-CM | POA: Insufficient documentation

## 2016-01-10 DIAGNOSIS — R109 Unspecified abdominal pain: Secondary | ICD-10-CM | POA: Diagnosis present

## 2016-01-10 DIAGNOSIS — R1032 Left lower quadrant pain: Secondary | ICD-10-CM

## 2016-01-10 DIAGNOSIS — R1031 Right lower quadrant pain: Secondary | ICD-10-CM | POA: Diagnosis not present

## 2016-01-10 LAB — URINALYSIS, ROUTINE W REFLEX MICROSCOPIC
BILIRUBIN URINE: NEGATIVE
Glucose, UA: NEGATIVE mg/dL
Hgb urine dipstick: NEGATIVE
Ketones, ur: NEGATIVE mg/dL
LEUKOCYTES UA: NEGATIVE
Nitrite: NEGATIVE
Protein, ur: NEGATIVE mg/dL
SPECIFIC GRAVITY, URINE: 1.015 (ref 1.005–1.030)
pH: 7 (ref 5.0–8.0)

## 2016-01-10 LAB — POCT PREGNANCY, URINE: PREG TEST UR: NEGATIVE

## 2016-01-10 MED ORDER — KETOROLAC TROMETHAMINE 60 MG/2ML IM SOLN
60.0000 mg | Freq: Once | INTRAMUSCULAR | Status: AC
  Administered 2016-01-10: 60 mg via INTRAMUSCULAR
  Filled 2016-01-10: qty 2

## 2016-01-10 NOTE — Discharge Instructions (Signed)
Follow up with your doctor if this pain continues. Keep track of the pain to see how often it occurs and if it is on a monthly cycle. There are no ovarian cysts today. If you develop fever, vomiting or diarrhea, you will need [facility] or [facility].

## 2016-01-10 NOTE — MAU Note (Signed)
Pt states she had the Mirena placed in April and since then she has had ovarian cysts bilaterally. Dr. Rana SnareLowe told her to take BCPs. Pt state she has been taking them but this AM woke up with excruciating right sided pain. Pt's LMP is unknown and she has sporadic spotting.

## 2016-01-10 NOTE — MAU Provider Note (Signed)
History     CSN: [id number]  Arrival date and time: 01/10/16 [id number]   First Provider Initiated Contact with Patient 01/10/16 1003      Chief Complaint  Patient presents with  . Abdominal Pain   HPI Wendy Perez 35 y.o.  Comes to MAU with periodic but severe pain that has happened since about 4 am today.  Took Ibuprofen but the pain still comes.  When the pain comes, she is unable to stand up straight or talk.  Is very worried it is an unresolved ovarian cyst that is causing her pain.  Is worried about having this pain occur when she is at work and is scheduled to work Advertising account executivetomorrow.  Denies any problems with vaginal discharge.  Is not having fever or nausea and vomiting.  No diarrhea.  Has daily regular BMs.   OB History    Gravida Para Term Preterm AB Living   3 2 2  0 1 2   SAB TAB Ectopic Multiple Live Births   1 0 0 0 1      Past Medical History:  Diagnosis Date  . Medical history non-contributory     Past Surgical History:  Procedure Laterality Date  . CESAREAN SECTION    . cesarian  2011    Family History  Problem Relation Age of Onset  . Hypertension Father     Social History  Substance Use Topics  . Smoking status: Never Smoker  . Smokeless tobacco: Never Used  . Alcohol use No    Allergies:  Allergies  Allergen Reactions  . Doxycycline Nausea And Vomiting  . Sulfa Antibiotics Hives    Reaction hives    Prescriptions Prior to Admission  Medication Sig Dispense Refill Last Dose  . ibuprofen (ADVIL,MOTRIN) 200 MG tablet Take 200 mg by mouth every 6 (six) hours as needed.   01/10/2016 at Unknown time  . Norethindrone-Ethinyl Estradiol-Fe Biphas (LO LOESTRIN FE) 1 MG-10 MCG / 10 MCG tablet Take 1 tablet by mouth daily.   01/09/2016 at Unknown time    Review of Systems  Constitutional: Negative for fever.  Gastrointestinal: Positive for abdominal pain. Negative for constipation, diarrhea, nausea and vomiting.       Abdominal pain is in the RLQ and is  intermittent  Genitourinary:       No vaginal discharge. No vaginal bleeding. No dysuria.   Physical Exam   Blood pressure 134/77, pulse 80, temperature 97.5 F (36.4 C), temperature source Oral, resp. rate 18, height 5\' 2"  (1.575 m), weight 135 lb (61.2 kg), unknown if currently breastfeeding.  Physical Exam  Nursing note and vitals reviewed. Constitutional: She is oriented to person, place, and time. She appears well-developed and well-nourished.  HENT:  Head: Normocephalic.  Eyes: EOM are normal.  Neck: Neck supple.  GI: Soft. There is tenderness. There is no rebound and no guarding.  Mild tenderness with deep palpation in RLQ.  No lymph adenopathy noted bilaterally.  Genitourinary:  Genitourinary Comments: Declines pelvic exam  Musculoskeletal: Normal range of motion.  Neurological: She is alert and oriented to person, place, and time.  Skin: Skin is warm and dry.  Psychiatric: She has a normal mood and affect.    MAU Course  Procedures Results for orders placed or performed during the hospital encounter of 01/10/16 (from the past 24 hour(s))  Urinalysis, Routine w reflex microscopic (not at [facility])     Status: None   Collection Time: 01/10/16 10:00 AM  Result Value Ref Range  Color, Urine YELLOW YELLOW   APPearance CLEAR CLEAR   Specific Gravity, Urine 1.015 1.005 - 1.030   pH 7.0 5.0 - 8.0   Glucose, UA NEGATIVE NEGATIVE mg/dL   Hgb urine dipstick NEGATIVE NEGATIVE   Bilirubin Urine NEGATIVE NEGATIVE   Ketones, ur NEGATIVE NEGATIVE mg/dL   Protein, ur NEGATIVE NEGATIVE mg/dL   Nitrite NEGATIVE NEGATIVE   Leukocytes, UA NEGATIVE NEGATIVE  Pregnancy, urine POC     Status: None   Collection Time: 01/10/16 10:01 AM  Result Value Ref Range   Preg Test, Ur NEGATIVE NEGATIVE   CLINICAL DATA:  Patient with right lower quadrant abdominal pain, intermittent. History of intrauterine device.  EXAM: TRANSABDOMINAL AND TRANSVAGINAL ULTRASOUND OF  PELVIS  TECHNIQUE: Both transabdominal and transvaginal ultrasound examinations of the pelvis were performed. Transabdominal technique was performed for global imaging of the pelvis including uterus, ovaries, adnexal regions, and pelvic cul-de-sac. It was necessary to proceed with endovaginal exam following the transabdominal exam to visualize the endometrium and adnexal structures.  COMPARISON:  None  FINDINGS: Uterus  Measurements: 9.0 x 5.0 x 6.5 cm. No fibroids or other mass visualized.  Endometrium  Thickness: 3.1 mm. Intrauterine device is visualized and appears in appropriate position.  Right ovary  Measurements: 2.5 x 1.7 x 2.0 cm. Normal appearance/no adnexal mass.  Left ovary  Measurements: 3.1 x 1.8 x 2.5 cm. Normal appearance/no adnexal mass.  Other findings  No abnormal free fluid.  IMPRESSION: Intrauterine device appears in appropriate position.  Normal sonographic appearance of the ovaries bilaterally.  MDM Discussed possible pelvic exam to check for vaginal infection - client declines today stating she is not having any problems with vaginal discharge. At time of discharge to home, client is very comfortable and is not having any abdominal pain. Discussed with client that there are no ovarian cysts and the cause of her pain is unknown at this time.  As she is comfortable at this time and her pain has been intermittent, there is no indication for further evaluation today.    Assessment and Plan  Right lower quadrant abdominal pain - intermittent  Plan Follow up with your doctor if your pain is continuing Seek immediate care if you are having fever, nausea and vomiting or diarrhea along with the RLQ pain at the [facility] at [facility] or [facility].  Terri L Burleson 01/10/2016, 11:57 AM

## 2016-06-13 ENCOUNTER — Telehealth: Payer: 59 | Admitting: Family

## 2016-06-13 DIAGNOSIS — J019 Acute sinusitis, unspecified: Secondary | ICD-10-CM

## 2016-06-13 MED ORDER — AMOXICILLIN-POT CLAVULANATE 875-125 MG PO TABS: 1.0000 | ORAL_TABLET | Freq: Two times a day (BID) | ORAL | 0 refills | Status: DC

## 2016-06-13 NOTE — Progress Notes (Signed)

## 2016-09-02 DIAGNOSIS — Z01419 Encounter for gynecological examination (general) (routine) without abnormal findings: Secondary | ICD-10-CM | POA: Diagnosis not present

## 2016-10-10 MED FILL — ESCITALOPRAM 10 MG TABLET: 10 | 30 days supply | Qty: 30 | Fill #0

## 2016-11-16 MED FILL — ESCITALOPRAM 10 MG TABLET: 10 | 30 days supply | Qty: 30 | Fill #1

## 2016-11-19 ENCOUNTER — Telehealth: Payer: 59 | Admitting: Physician Assistant

## 2016-11-19 DIAGNOSIS — B9689 Other specified bacterial agents as the cause of diseases classified elsewhere: Secondary | ICD-10-CM | POA: Diagnosis not present

## 2016-11-19 DIAGNOSIS — J019 Acute sinusitis, unspecified: Secondary | ICD-10-CM

## 2016-11-19 MED ORDER — AMOXICILLIN-POT CLAVULANATE 875-125 MG PO TABS: 1.0000 | ORAL_TABLET | Freq: Two times a day (BID) | ORAL | 0 refills | Status: DC

## 2016-11-19 NOTE — Progress Notes (Signed)

## 2017-01-03 MED FILL — ESCITALOPRAM 10 MG TABLET: 10 | 30 days supply | Qty: 30 | Fill #0

## 2017-01-04 ENCOUNTER — Telehealth: Payer: 59 | Admitting: Nurse Practitioner

## 2017-01-04 DIAGNOSIS — J0101 Acute recurrent maxillary sinusitis: Secondary | ICD-10-CM

## 2017-01-04 MED ORDER — AMOXICILLIN-POT CLAVULANATE 875-125 MG PO TABS: 1.0000 | ORAL_TABLET | Freq: Two times a day (BID) | ORAL | 0 refills | Status: DC

## 2017-01-04 MED FILL — AMOX TR-K CLV 875-125 MG TA: 875-125 | 7 days supply | Qty: 14 | Fill #0

## 2017-01-04 NOTE — Progress Notes (Signed)

## 2017-01-17 ENCOUNTER — Telehealth: Payer: 59 | Admitting: Family

## 2017-01-17 DIAGNOSIS — J028 Acute pharyngitis due to other specified organisms: Secondary | ICD-10-CM

## 2017-01-17 MED ORDER — AMOXICILLIN-POT CLAVULANATE 875-125 MG PO TABS: 1.0000 | ORAL_TABLET | Freq: Two times a day (BID) | ORAL | 0 refills | Status: DC

## 2017-01-17 NOTE — Progress Notes (Signed)

## 2017-02-23 MED FILL — ESCITALOPRAM 10 MG TABLET: 10 | 30 days supply | Qty: 30 | Fill #1

## 2017-03-08 DIAGNOSIS — M545 Low back pain: Secondary | ICD-10-CM | POA: Diagnosis not present

## 2017-03-08 DIAGNOSIS — M62838 Other muscle spasm: Secondary | ICD-10-CM | POA: Diagnosis not present

## 2017-03-29 ENCOUNTER — Telehealth: Payer: 59 | Admitting: Family

## 2017-03-29 DIAGNOSIS — R6889 Other general symptoms and signs: Secondary | ICD-10-CM

## 2017-03-29 MED ORDER — OSELTAMIVIR PHOSPHATE 75 MG PO CAPS: 75.0000 mg | ORAL_CAPSULE | Freq: Two times a day (BID) | ORAL | 0 refills | Status: DC

## 2017-03-29 NOTE — Progress Notes (Signed)

## 2017-04-07 MED FILL — ESCITALOPRAM 10 MG TABLET: 10 | 30 days supply | Qty: 30 | Fill #2

## 2017-06-08 DIAGNOSIS — M545 Low back pain: Secondary | ICD-10-CM | POA: Diagnosis not present

## 2017-06-08 DIAGNOSIS — M62838 Other muscle spasm: Secondary | ICD-10-CM | POA: Diagnosis not present

## 2017-06-20 ENCOUNTER — Telehealth: Payer: 59 | Admitting: Physician Assistant

## 2017-06-20 DIAGNOSIS — N390 Urinary tract infection, site not specified: Secondary | ICD-10-CM | POA: Diagnosis not present

## 2017-06-20 MED ORDER — AMOXICILLIN-POT CLAVULANATE 875-125 MG PO TABS: 1.0000 | ORAL_TABLET | Freq: Two times a day (BID) | ORAL | 0 refills | Status: AC

## 2017-06-20 NOTE — Progress Notes (Signed)
Hi Wendy Perez,  I tried to call several times to get some clarity on your symptoms.  Given your flank pain and fever I do worry that you may have the beginnings of a kidney infection.  I am happy to prescribe you antibiotics for this however they may not always work when taken by mouth.  Please seek face-to-face care at the urgent care if at any point you have fever greater than 100.4, begin sweating, become dizzy, began having rapid heart rate.  I am placing you on Augmentin as this will have excellent coverage for most of the microorganisms that can cause a UTI as well as pyelonephritis.  Please remember to seek care if any of the above symptoms start occurring.   We are sorry that you are not feeling well.  Here is how we plan to help!  Based on what you shared with me it looks like you most likely have a urinary tract infection.  A UTI (Urinary Tract Infection) is a bacterial infection of the bladder.  Most cases of urinary tract infections are simple to treat but a key part of your care is to encourage you to drink plenty of fluids and watch your symptoms carefully.  Your symptoms should gradually improve. Call us if the burning in your urine worsens, you develop worsening fever, back pain or pelvic pain or if your symptoms do not resolve after completing the antibiotic.  Urinary tract infections can be prevented by drinking plenty of water to keep your body hydrated.  Also be sure when you wipe, wipe from front to back and don't hold it in!  If possible, empty your bladder every 4 hours.  Your e-visit answers were reviewed by a board certified advanced clinical practitioner to complete your personal care plan.  Depending on the condition, your plan could have included both over the counter or prescription medications.  If there is a problem please reply  once you have received a response from your provider.  Your safety is important to [place].  If you have drug allergies check your prescription  carefully.    You can use MyChart to ask questions about today's visit, request a non-urgent call back, or ask for a work or school excuse for 24 hours related to this e-Visit. If it has been greater than 24 hours you will need to follow up with your provider, or enter a new e-Visit to address those concerns.   You will get an e-mail in the next two days asking about your experience.  I hope that your e-visit has been valuable and will speed your recovery. Thank you for using e-visits.

## 2017-08-04 DIAGNOSIS — H5213 Myopia, bilateral: Secondary | ICD-10-CM | POA: Diagnosis not present

## 2017-08-18 ENCOUNTER — Telehealth: Payer: 59 | Admitting: Family

## 2017-08-18 DIAGNOSIS — J329 Chronic sinusitis, unspecified: Secondary | ICD-10-CM | POA: Diagnosis not present

## 2017-08-18 DIAGNOSIS — B9689 Other specified bacterial agents as the cause of diseases classified elsewhere: Secondary | ICD-10-CM | POA: Diagnosis not present

## 2017-08-18 MED ORDER — AMOXICILLIN-POT CLAVULANATE 875-125 MG PO TABS: 1.0000 | ORAL_TABLET | Freq: Two times a day (BID) | ORAL | 0 refills | Status: AC

## 2017-08-18 NOTE — Progress Notes (Signed)

## 2017-08-24 ENCOUNTER — Telehealth: Payer: 59 | Admitting: Family

## 2017-08-24 DIAGNOSIS — B3731 Acute candidiasis of vulva and vagina: Secondary | ICD-10-CM

## 2017-08-24 DIAGNOSIS — B373 Candidiasis of vulva and vagina: Secondary | ICD-10-CM | POA: Diagnosis not present

## 2017-08-24 MED ORDER — FLUCONAZOLE 150 MG PO TABS: 150.0000 mg | ORAL_TABLET | Freq: Once | ORAL | 0 refills | Status: AC

## 2017-08-24 NOTE — Progress Notes (Signed)

## 2017-08-31 DIAGNOSIS — M545 Low back pain: Secondary | ICD-10-CM | POA: Diagnosis not present

## 2017-09-18 ENCOUNTER — Telehealth: Payer: 59 | Admitting: Family

## 2017-09-18 DIAGNOSIS — N760 Acute vaginitis: Secondary | ICD-10-CM

## 2017-09-18 NOTE — Progress Notes (Signed)
Based on what you shared with me it looks like you have a condition that should be evaluated in a face to face office visit.  NOTE: If you entered your credit card information for this eVisit, you will not be charged. You may see a "hold" on your card for the $30 but that hold will drop off and you will not have a charge processed.  If you are having a true medical emergency please call 911.  If you need an urgent face to face visit, [facility] has four urgent care centers for your convenience.  If you need care fast and have a high deductible or no insurance consider:   [email or web address] to reserve your spot online an avoid wait times  [facility] [address], [address] [place], [place] [address] 8 am to 8 pm Monday-Friday 10 am to 4 pm Saturday-Sunday *Across the street from [employer]  [facility]  [address] [place], [address] 8 am to 5 pm Monday-Friday * In the [facility] the [facility]   The following sites will take your  insurance:  . [facility]  [phone] Get Driving Directions Find a Provider at this Location  [address] [place], [place] [address] . 10 am to 8 pm Monday-Friday . 12 pm to 8 pm Saturday-Sunday   . [facility]  [phone] Get Driving Directions Find a Provider at this Location  [address], [address] [place], [place] [address] . 8 am to 8 pm Monday-Friday . 9 am to 6 pm Saturday . 11 am to 6 pm Sunday   . [facility] [place]  [phone] Get Driving Directions  [address] [place],  [address] . 8 am to 8 pm Monday-Friday . 8 am to 4 pm Saturday-Sunday   Your e-visit answers were reviewed by a board certified advanced clinical practitioner to complete your personal care plan.  Thank you for using e-Visits. 

## 2017-09-19 ENCOUNTER — Other Ambulatory Visit: Payer: Self-pay

## 2017-09-19 ENCOUNTER — Encounter (HOSPITAL_COMMUNITY): Payer: Self-pay | Admitting: Physician Assistant

## 2017-09-19 ENCOUNTER — Ambulatory Visit (HOSPITAL_COMMUNITY)
Admission: EM | Admit: 2017-09-19 | Discharge: 2017-09-19 | Disposition: A | Discharge status: Home / Self Care | Payer: 59 | Attending: Family Medicine | Admitting: Family Medicine

## 2017-09-19 DIAGNOSIS — J020 Streptococcal pharyngitis: Secondary | ICD-10-CM

## 2017-09-19 LAB — POCT RAPID STREP A: STREPTOCOCCUS, GROUP A SCREEN (DIRECT): POSITIVE — AB

## 2017-09-19 MED ORDER — LIDOCAINE VISCOUS HCL 2 % MT SOLN: 15.0000 mL | OROMUCOSAL | 0 refills | Status: DC | PRN

## 2017-09-19 MED ORDER — CEPHALEXIN 500 MG PO CAPS: 500.0000 mg | ORAL_CAPSULE | Freq: Two times a day (BID) | ORAL | 0 refills | Status: AC

## 2017-09-19 MED ORDER — FLUCONAZOLE 150 MG PO TABS: 150.0000 mg | ORAL_TABLET | Freq: Every day | ORAL | 0 refills | Status: DC

## 2017-09-19 NOTE — Discharge Instructions (Addendum)
Rapid strep positive. Start keflex as directed. Start lidocaine for sore throat, do not eat or drink for the next 40 mins after use as it can stunt your gag reflexTylenol/Motrin for fever and pain. Monitor for any worsening of symptoms, trouble breathing, trouble swallowing, swelling of the throat, leaning forward to breath, drooling, follow up here or at the emergency department for reevaluation.

## 2017-09-19 NOTE — ED Triage Notes (Signed)
C/o sore throat 4 days 

## 2017-09-19 NOTE — ED Triage Notes (Signed)
Pt also has white spots in the back of her throat.

## 2017-09-19 NOTE — ED Provider Notes (Signed)
[facility]    CSN: [id number] Arrival date & time: 09/19/17  1656     History   Chief Complaint Chief Complaint  Patient presents with  . Sore Throat    HPI Wendy Perez is a 37 y.o. female.   37 year old female comes in for 4-day history of sore throat.  Denies rhinorrhea, nasal congestion, cough.  Denies fever, chills, night sweats.  Denies trouble swallowing, swelling of the throat, trouble breathing, trismus, drooling, tripoding.  States has been gargling salt water with some relief, however, woke up this morning with white spots to throat, and came in for evaluation.     Past Medical History:  Diagnosis Date  . Medical history non-contributory     Patient Active Problem List   Diagnosis Date Noted  . VBAC (vaginal birth after Cesarean) 09/12/2013  . Normal labor 09/11/2013    Past Surgical History:  Procedure Laterality Date  . CESAREAN SECTION    . cesarian  2011    OB History    Gravida  3   Para  2   Term  2   Preterm  0   AB  1   Living  2     SAB  1   TAB  0   Ectopic  0   Multiple  0   Live Births  1            Home Medications    Prior to Admission medications   Medication Sig Start Date End Date Taking? Authorizing Provider  cephALEXin (KEFLEX) 500 MG capsule Take 1 capsule (500 mg total) by mouth 2 (two) times daily for 10 days. 09/19/17 09/29/17  Belinda FisherYu, Amy V, PA-C  fluconazole (DIFLUCAN) 150 MG tablet Take 1 tablet (150 mg total) by mouth daily. Take second dose 72 hours later if symptoms still persists. 09/19/17   Cathie HoopsYu, Amy V, PA-C  lidocaine (XYLOCAINE) 2 % solution Use as directed 15 mLs in the mouth or throat as needed for mouth pain. 09/19/17   Belinda FisherYu, Amy V, PA-C    Family History Family History  Problem Relation Age of Onset  . Hypertension Father     Social History Social History   Tobacco Use  . Smoking status: Never Smoker  . Smokeless tobacco: Never Used  Substance Use Topics  . Alcohol use: No  .  Drug use: No     Allergies   Doxycycline and Sulfa antibiotics   Review of Systems Review of Systems  Reason unable to perform ROS: See HPI as above.     Physical Exam Triage Vital Signs ED Triage Vitals  Enc Vitals Group     BP 09/19/17 1722 133/85     Pulse Rate 09/19/17 1722 68     Resp 09/19/17 1722 16     Temp 09/19/17 1722 98.4 F (36.9 C)     Temp Source 09/19/17 1722 Oral     SpO2 09/19/17 1722 100 %     Weight 09/19/17 1725 151 lb (68.5 kg)     Height --      Head Circumference --      Peak Flow --      Pain Score 09/19/17 1725 2     Pain Loc --      Pain Edu? --      Excl. in GC? --    No data found.  Updated Vital Signs BP 133/85   Pulse 68   Temp 98.4 F (36.9 C) (Oral)  Resp 16   Wt 151 lb (68.5 kg)   LMP 09/03/2017   SpO2 100%   BMI 27.62 kg/m   Visual Acuity Right Eye Distance:   Left Eye Distance:   Bilateral Distance:    Right Eye Near:   Left Eye Near:    Bilateral Near:     Physical Exam  Constitutional: She is oriented to person, place, and time. She appears well-developed and well-nourished.  Non-toxic appearance. She does not appear ill. No distress.  HENT:  Head: Normocephalic and atraumatic.  Right Ear: External ear and ear canal normal.  Left Ear: External ear and ear canal normal.  Nose: Nose normal. Right sinus exhibits no maxillary sinus tenderness and no frontal sinus tenderness. Left sinus exhibits no maxillary sinus tenderness and no frontal sinus tenderness.  Mouth/Throat: Uvula is midline, oropharynx is clear and moist and mucous membranes are normal. Tonsils are 2+ on the right. Tonsils are 2+ on the left. Tonsillar exudate.  Bilateral cerumen impaction, TM not visible.  Eyes: Pupils are equal, round, and reactive to light. Conjunctivae are normal.  Neck: Normal range of motion. Neck supple.  Cardiovascular: Normal rate, regular rhythm and normal heart sounds. Exam reveals no gallop and no friction rub.  No murmur  heard. Pulmonary/Chest: Effort normal and breath sounds normal. She has no decreased breath sounds. She has no wheezes. She has no rhonchi. She has no rales.  Lymphadenopathy:    She has no cervical adenopathy.  Neurological: She is alert and oriented to person, place, and time.  Skin: Skin is warm and dry.  Psychiatric: She has a normal mood and affect. Her behavior is normal. Judgment normal.     UC Treatments / Results  Labs (all labs ordered are listed, but only abnormal results are displayed) Labs Reviewed  POCT RAPID STREP A - Abnormal; Notable for the following components:      Result Value   Streptococcus, Group A Screen (Direct) POSITIVE (*)    All other components within normal limits    EKG None  Radiology No results found.  Procedures Procedures (including critical care time)  Medications Ordered in UC Medications - No data to display  Initial Impression / Assessment and Plan / UC Course  I have reviewed the triage vital signs and the nursing notes.  Pertinent labs & imaging results that were available during my care of the patient were reviewed by me and considered in my medical decision making (see chart for details).    Rapid strep positive.  Patient had Augmentin for sinusitis 2 weeks ago, will have patient take Keflex for strep.  Diflucan to prevent yeast infection.  Symptomatic treatment as needed. Return precautions given.   Final Clinical Impressions(s) / UC Diagnoses   Final diagnoses:  Strep pharyngitis    ED Prescriptions    Medication Sig Dispense Auth. Provider   cephALEXin (KEFLEX) 500 MG capsule Take 1 capsule (500 mg total) by mouth 2 (two) times daily for 10 days. 20 capsule Yu, Amy V, PA-C   fluconazole (DIFLUCAN) 150 MG tablet Take 1 tablet (150 mg total) by mouth daily. Take second dose 72 hours later if symptoms still persists. 2 tablet Yu, Amy V, PA-C   lidocaine (XYLOCAINE) 2 % solution Use as directed 15 mLs in the mouth or throat  as needed for mouth pain. 100 mL Threasa Alpha, [place] 09/19/17 1810

## 2017-09-22 DIAGNOSIS — H5319 Other subjective visual disturbances: Secondary | ICD-10-CM | POA: Diagnosis not present

## 2017-09-22 DIAGNOSIS — S0591XA Unspecified injury of right eye and orbit, initial encounter: Secondary | ICD-10-CM | POA: Diagnosis not present

## 2017-09-30 IMAGING — US US PELVIS COMPLETE
1 series · 15 of 25 positions shown · non-contrast
Comparison: None

CLINICAL DATA: Patient with right lower quadrant abdominal pain,
intermittent. History of intrauterine device.

EXAM:
TRANSABDOMINAL AND TRANSVAGINAL ULTRASOUND OF PELVIS
TECHNIQUE: Both transabdominal and transvaginal ultrasound examinations of the
pelvis were performed. Transabdominal technique was performed for
global imaging of the pelvis including uterus, ovaries, adnexal
regions, and pelvic cul-de-sac. It was necessary to proceed with
endovaginal exam following the transabdominal exam to visualize the
endometrium and adnexal structures.

[Series 2: us pelvis complete · 15 of 44 slices shown]
[im 1/44]
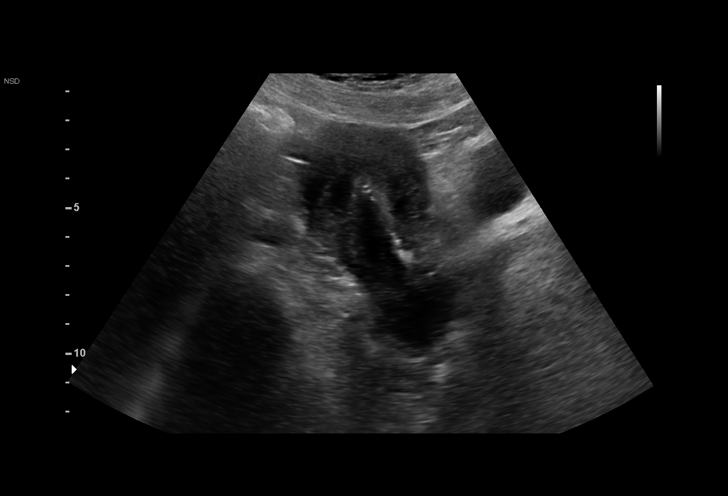
[im 4/44]
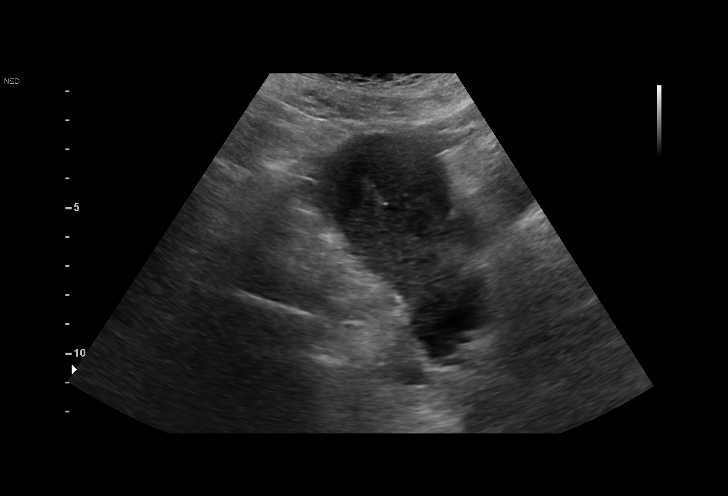
[im 8/44]
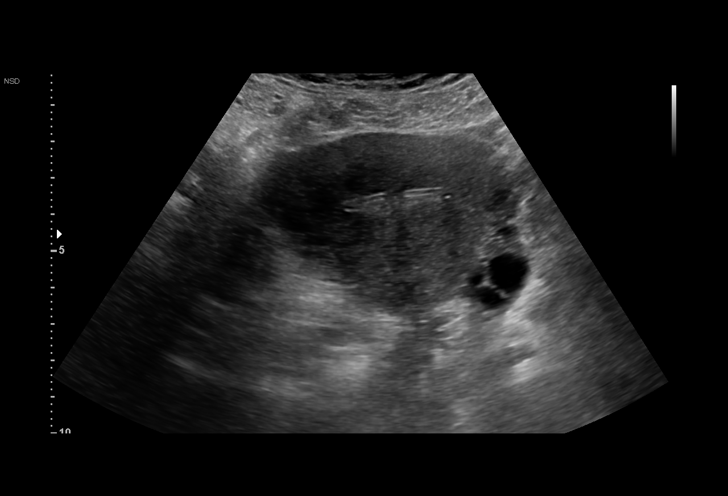
[im 9/44]
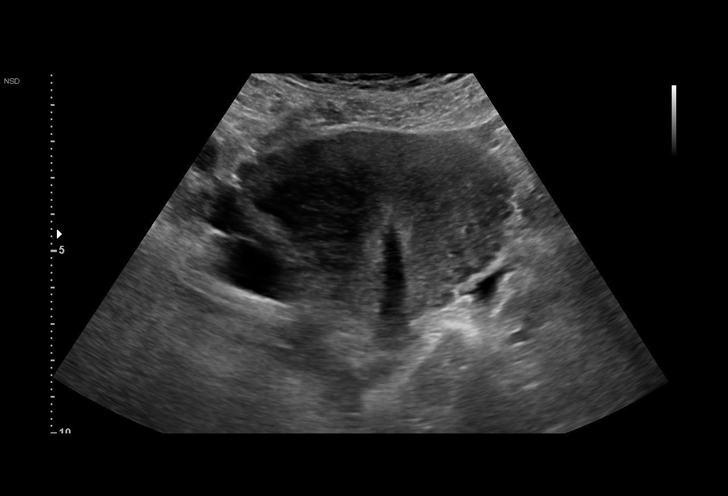
[im 13/44]
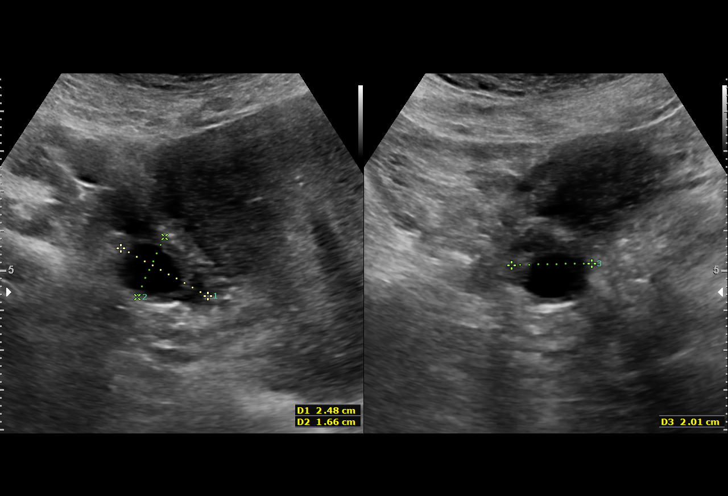
[im 17/44]
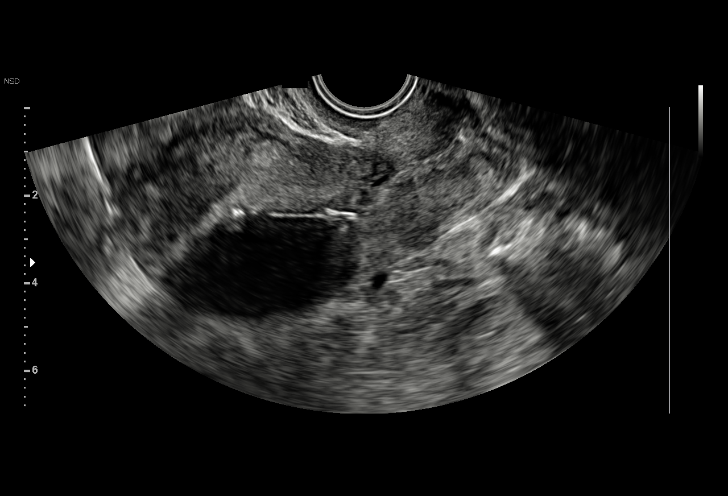
[im 18/44]
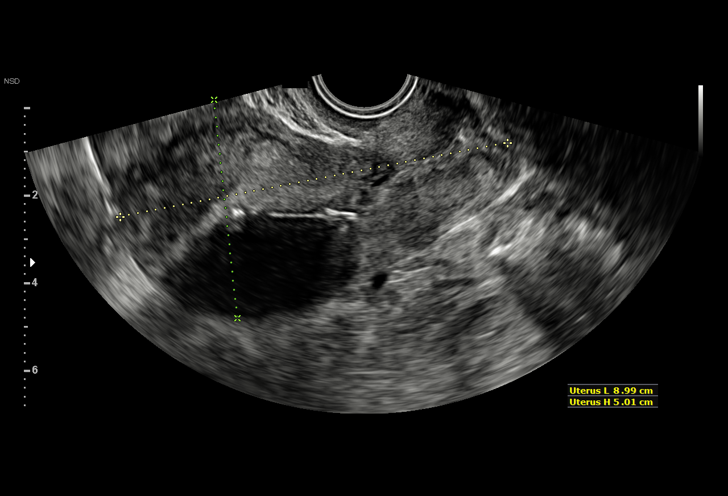
[im 22/44]
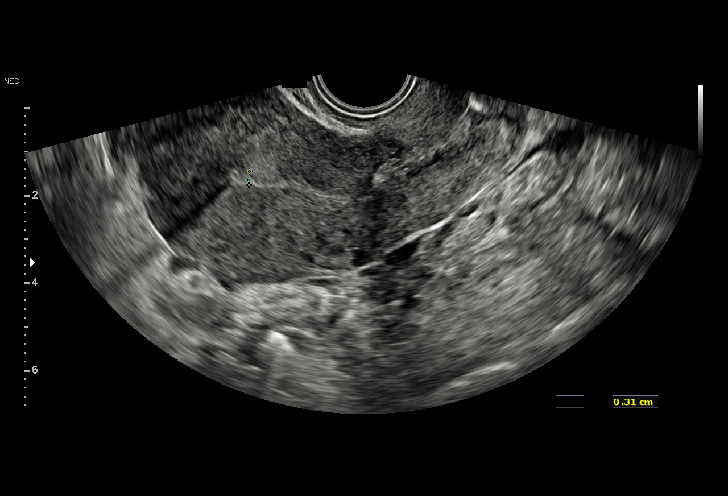
[im 26/44]
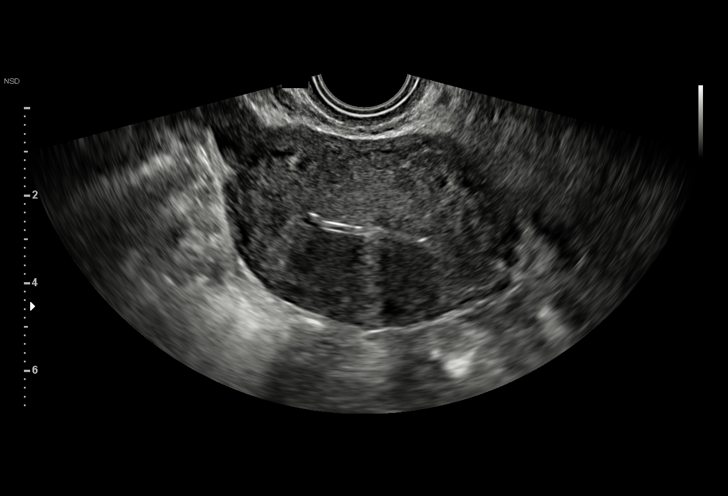
[im 27/44]
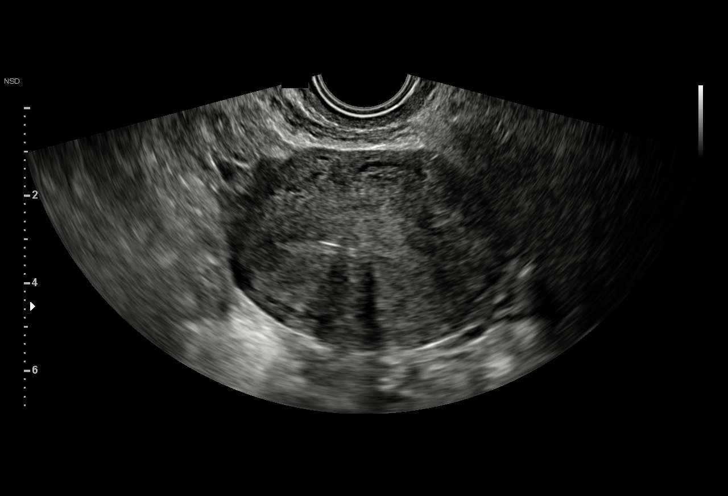
[im 31/44]
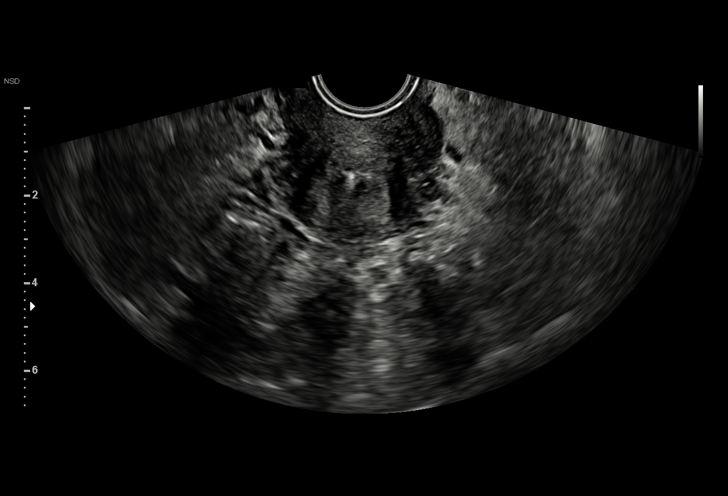
[im 35/44]
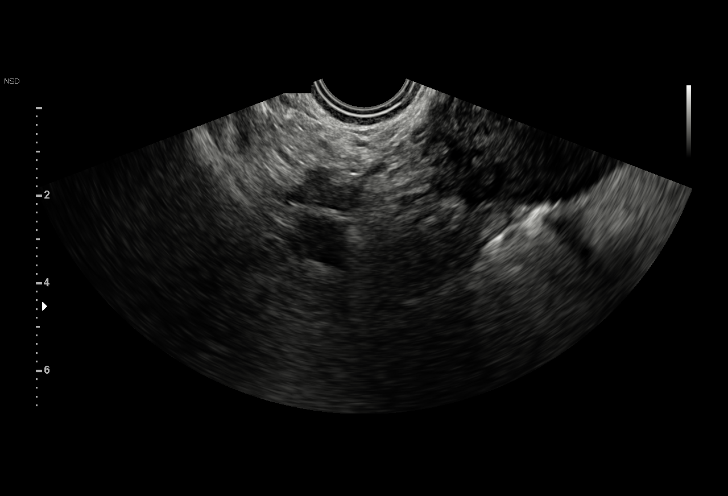
[im 36/44]
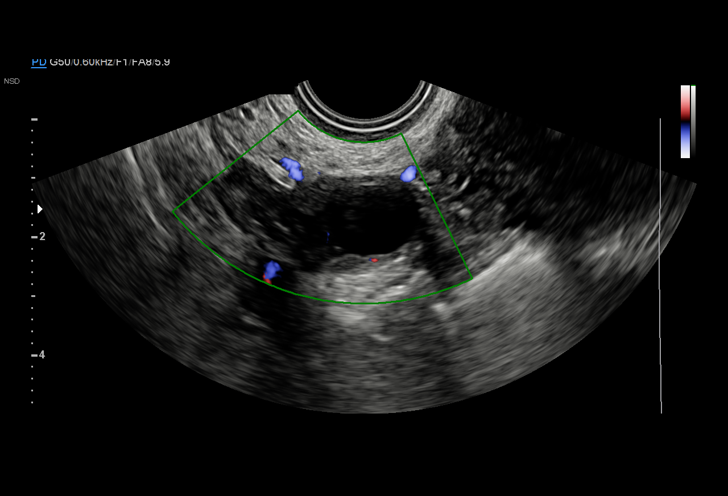
[im 40/44]
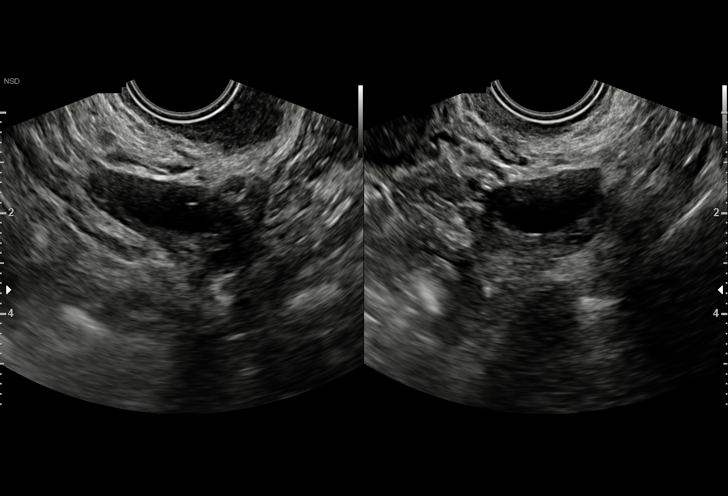
[im 44/44]
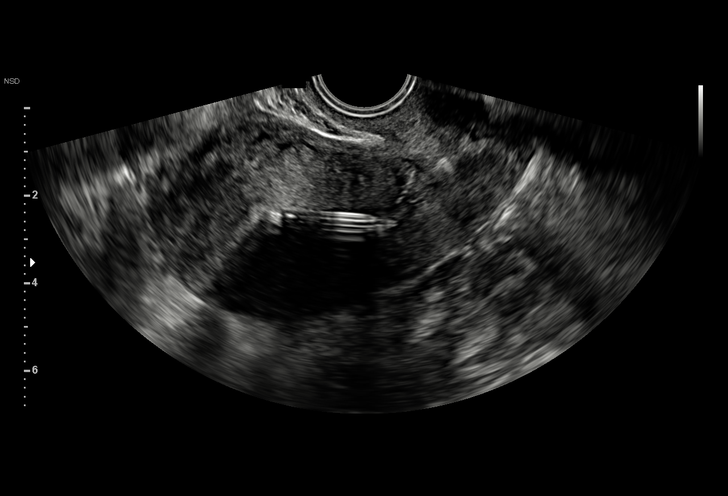

[15 of 25 positions shown; findings below may reference images not displayed]

FINDINGS: Uterus

Measurements: 9.0 x 5.0 x 6.5 cm. No fibroids or other mass
visualized.

Endometrium

Thickness: 3.1 mm. Intrauterine device is visualized and appears in
appropriate position.

Right ovary

Measurements: 2.5 x 1.7 x 2.0 cm. Normal appearance/no adnexal mass.

Left ovary

Measurements: 3.1 x 1.8 x 2.5 cm. Normal appearance/no adnexal mass.

Other findings

No abnormal free fluid.
IMPRESSION: Intrauterine device appears in appropriate position.

Normal sonographic appearance of the ovaries bilaterally.

## 2017-10-03 DIAGNOSIS — M545 Low back pain: Secondary | ICD-10-CM | POA: Diagnosis not present

## 2017-12-29 ENCOUNTER — Telehealth: Payer: 59 | Admitting: Family

## 2017-12-29 DIAGNOSIS — R05 Cough: Secondary | ICD-10-CM

## 2017-12-29 DIAGNOSIS — J029 Acute pharyngitis, unspecified: Secondary | ICD-10-CM

## 2017-12-29 DIAGNOSIS — R059 Cough, unspecified: Secondary | ICD-10-CM

## 2017-12-29 MED ORDER — BENZONATATE 100 MG PO CAPS: 100.0000 mg | ORAL_CAPSULE | Freq: Three times a day (TID) | ORAL | 0 refills | Status: DC | PRN

## 2017-12-29 NOTE — Progress Notes (Signed)
Thank you for the details you included in the comment boxes. Those details are very helpful in determining the best course of treatment for you and help us to provide the best care.  We are sorry that you are not feeling well.  Here is how we plan to help!  Based on your presentation I believe you most likely have A cough due to a virus.  This is called viral bronchitis and is best treated by rest, plenty of fluids and control of the cough.  You may use Ibuprofen or Tylenol as directed to help your symptoms.     In addition you may use A non-prescription cough medication called Mucinex DM: take 2 tablets every 12 hours. and A prescription cough medication called Tessalon Perles 100mg. You may take 1-2 capsules every 8 hours as needed for your cough.    From your responses in the eVisit questionnaire you describe inflammation in the upper respiratory tract which is causing a significant cough.  This is commonly called Bronchitis and has four common causes:    Allergies  Viral Infections  Acid Reflux  Bacterial Infection Allergies, viruses and acid reflux are treated by controlling symptoms or eliminating the cause. An example might be a cough caused by taking certain blood pressure medications. You stop the cough by changing the medication. Another example might be a cough caused by acid reflux. Controlling the reflux helps control the cough.  USE OF BRONCHODILATOR ("RESCUE") INHALERS: There is a risk from using your bronchodilator too frequently.  The risk is that over-reliance on a medication which only relaxes the muscles surrounding the breathing tubes can reduce the effectiveness of medications prescribed to reduce swelling and congestion of the tubes themselves.  Although you feel brief relief from the bronchodilator inhaler, your asthma may actually be worsening with the tubes becoming more swollen and filled with mucus.  This can delay other crucial treatments, such as oral steroid  medications. If you need to use a bronchodilator inhaler daily, several times per day, you should discuss this with your provider.  There are probably better treatments that could be used to keep your asthma under control.     HOME CARE . Only take medications as instructed by your medical team. . Complete the entire course of an antibiotic. . Drink plenty of fluids and get plenty of rest. . Avoid close contacts especially the very young and the elderly . Cover your mouth if you cough or cough into your sleeve. . Always remember to wash your hands . A steam or ultrasonic humidifier can help congestion.   GET HELP RIGHT AWAY IF: . You develop worsening fever. . You become short of breath . You cough up blood. . Your symptoms persist after you have completed your treatment plan MAKE SURE YOU   Understand these instructions.  Will watch your condition.  Will get help right away if you are not doing well or get worse.  Your e-visit answers were reviewed by a board certified advanced clinical practitioner to complete your personal care plan.  Depending on the condition, your plan could have included both over the counter or prescription medications. If there is a problem please reply  once you have received a response from your provider. Your safety is important to us.  If you have drug allergies check your prescription carefully.    You can use MyChart to ask questions about today's visit, request a non-urgent call back, or ask for a work or school excuse   for 24 hours related to this e-Visit. If it has been greater than 24 hours you will need to follow up with your provider, or enter a new e-Visit to address those concerns. You will get an e-mail in the next two days asking about your experience.  I hope that your e-visit has been valuable and will speed your recovery. Thank you for using e-visits.   

## 2018-01-08 MED FILL — ESCITALOPRAM 20 MG TABLET: 20 | 30 days supply | Qty: 30 | Fill #0

## 2018-02-22 MED FILL — ESCITALOPRAM 20 MG TABLET: 20 | 30 days supply | Qty: 30 | Fill #1

## 2018-03-15 ENCOUNTER — Telehealth: Payer: 59 | Admitting: Physician Assistant

## 2018-03-15 DIAGNOSIS — J019 Acute sinusitis, unspecified: Secondary | ICD-10-CM

## 2018-03-15 DIAGNOSIS — B9689 Other specified bacterial agents as the cause of diseases classified elsewhere: Secondary | ICD-10-CM

## 2018-03-15 MED ORDER — AMOXICILLIN-POT CLAVULANATE 875-125 MG PO TABS: 1.0000 | ORAL_TABLET | Freq: Two times a day (BID) | ORAL | 0 refills | Status: DC

## 2018-03-15 NOTE — Progress Notes (Signed)

## 2018-04-05 MED FILL — ESCITALOPRAM 20 MG TABLET: 20 | 30 days supply | Qty: 30 | Fill #2

## 2018-04-25 ENCOUNTER — Other Ambulatory Visit: Payer: Self-pay

## 2018-04-25 ENCOUNTER — Ambulatory Visit (INDEPENDENT_AMBULATORY_CARE_PROVIDER_SITE_OTHER): Payer: Self-pay | Admitting: Nurse Practitioner

## 2018-04-25 VITALS — BP 110/65 | HR 89 | Temp 98.9°F | Resp 18 | Wt 160.8 lb

## 2018-04-25 DIAGNOSIS — J029 Acute pharyngitis, unspecified: Secondary | ICD-10-CM

## 2018-04-25 DIAGNOSIS — J020 Streptococcal pharyngitis: Secondary | ICD-10-CM

## 2018-04-25 DIAGNOSIS — R6889 Other general symptoms and signs: Secondary | ICD-10-CM

## 2018-04-25 LAB — POCT INFLUENZA A/B
INFLUENZA A, POC: NEGATIVE
Influenza B, POC: NEGATIVE

## 2018-04-25 LAB — POCT RAPID STREP A (OFFICE): Rapid Strep A Screen: POSITIVE — AB

## 2018-04-25 MED ORDER — LIDOCAINE VISCOUS HCL 2 % MT SOLN: 5.0000 mL | Freq: Four times a day (QID) | OROMUCOSAL | 0 refills | Status: AC | PRN

## 2018-04-25 MED ORDER — FLUTICASONE PROPIONATE 50 MCG/ACT NA SUSP: 2.0000 | Freq: Every day | NASAL | 0 refills | Status: DC

## 2018-04-25 MED ORDER — LIDOCAINE VISCOUS HCL 2 % MT SOLN: 5.0000 mL | Freq: Four times a day (QID) | OROMUCOSAL | 0 refills | Status: DC | PRN

## 2018-04-25 MED ORDER — AMOXICILLIN 875 MG PO TABS: 875.0000 mg | ORAL_TABLET | Freq: Two times a day (BID) | ORAL | 0 refills | Status: DC

## 2018-04-25 MED FILL — FLUTICASONE PROP 50 MCG SPR: 50 | 30 days supply | Qty: 16 | Fill #0

## 2018-04-25 MED FILL — AMOXICILLIN 875 MG TABS: 875 | 10 days supply | Qty: 20 | Fill #0

## 2018-04-25 NOTE — Patient Instructions (Signed)
Strep Throat  -Take medication as prescribed. -Ibuprofen or Tylenol for pain, fever, or general discomfort. -Increase fluids. -Get plenty of rest. -Warm saltwater gargles 3-4 times daily until symptoms improve. -Sleep elevated on at least 2 pillows at bedtime to help with cough. -Use a humidifier or vaporizer when at home and during sleep to help with cough. -May use a teaspoon of honey or over-the-counter cough drops to help with cough and sore throat -Follow-up if symptoms do not improve.        Strep throat is a bacterial infection of the throat. Your health care provider may call the infection tonsillitis or pharyngitis, depending on whether there is swelling in the tonsils or at the back of the throat. Strep throat is most common during the cold months of the year in children who are 4-33 years of age, but it can happen during any season in people of any age. This infection is spread from person to person (contagious) through coughing, sneezing, or close contact. What are the causes? Strep throat is caused by the bacteria called Streptococcus pyogenes. What increases the risk? This condition is more likely to develop in:  People who spend time in crowded places where the infection can spread easily.  People who have close contact with someone who has strep throat. What are the signs or symptoms? Symptoms of this condition include:  Fever or chills.  Redness, swelling, or pain in the tonsils or throat.  Pain or difficulty when swallowing.  White or yellow spots on the tonsils or throat.  Swollen, tender glands in the neck or under the jaw.  Red rash all over the body (rare). How is this diagnosed? This condition is diagnosed by performing a rapid strep test or by taking a swab of your throat (throat culture test). Results from a rapid strep test are usually ready in a few minutes, but throat culture test results are available after one or two days. How is this treated?  This condition is treated with antibiotic medicine. Follow these instructions at home: Medicines  Take over-the-counter and prescription medicines only as told by your health care provider.  Take your antibiotic as told by your health care provider. Do not stop taking the antibiotic even if you start to feel better.  Have family members who also have a sore throat or fever tested for strep throat. They may need antibiotics if they have the strep infection. Eating and drinking  Do not share food, drinking cups, or personal items that could cause the infection to spread to other people.  If swallowing is difficult, try eating soft foods until your sore throat feels better.  Drink enough fluid to keep your urine clear or pale yellow. General instructions  Gargle with a salt-water mixture 3-4 times per day or as needed. To make a salt-water mixture, completely dissolve -1 tsp of salt in 1 cup of warm water.  Make sure that all household members wash their hands well.  Get plenty of rest.  Stay home from school or work until you have been taking antibiotics for 24 hours.  Keep all follow-up visits as told by your health care provider. This is important. Contact a health care provider if:  The glands in your neck continue to get bigger.  You develop a rash, cough, or earache.  You cough up a thick liquid that is green, yellow-brown, or bloody.  You have pain or discomfort that does not get better with medicine.  Your problems seem to  be getting worse rather than better.  You have a fever. Get help right away if:  You have new symptoms, such as vomiting, severe headache, stiff or painful neck, chest pain, or shortness of breath.  You have severe throat pain, drooling, or changes in your voice.  You have swelling of the neck, or the skin on the neck becomes red and tender.  You have signs of dehydration, such as fatigue, dry mouth, and decreased urination.  You become  increasingly sleepy, or you cannot wake up completely.  Your joints become red or painful. This information is not intended to replace advice given to you by your health care provider. Make sure you discuss any questions you have with your health care provider. Document Released: 01/29/2000 Document Revised: 09/30/2015 Document Reviewed: 05/26/2014 Elsevier Interactive Patient Education  [employer].

## 2018-04-25 NOTE — Progress Notes (Signed)
MRN: [id number] DOB: 16-Mar-1980  Subjective:   Wendy Perez is a 38 y.o. female presenting for chief complaint of Sore Throat (1 day); Chills (1 day); Headache (1 day); Fever (1 day, 102.1 F  ); and Generalized Body Aches (1 day ) .  Reports 1 day history of fever, sinus headache and sore throat, chills, fatigue and malaise, postnasal drip, nausea. Has tried Ibuprofen and Mucinex for relief. Denies rhinorrhea, ear drainage, inability to swallow, productive cough, wheezing, shortness of breath, chest tightness, chest pain and myalgia, decreased appetite, vomiting, abdominal pain and diarrhea, hot potato voice, drooling or difficulty speaking. Has not had any sick contacts with strep and flu. Admits to a history of seasonal allergies, denies history of asthma. Patient has had a flu shot this season. Denies smoking and alcohol. Denies any other aggravating or relieving factors, no other questions or concerns.  Review of Systems  Constitutional: Positive for fever and malaise/fatigue.  HENT: Positive for congestion and sore throat (rates throat pain 5/10 at present).   Eyes: Negative.   Respiratory: Positive for cough. Negative for sputum production, shortness of breath and wheezing.   Cardiovascular: Negative.   Skin: Negative.   Neurological: Positive for headaches.  Endo/Heme/Allergies: Positive for environmental allergies.    Wendy Perez has a current medication list which includes the following prescription(s): ibuprofen, amoxicillin-clavulanate, benzonatate, escitalopram, fluconazole, and lidocaine. Also is allergic to doxycycline and sulfa antibiotics.  Wendy Perez  has a past medical history of Medical history non-contributory. Also  has a past surgical history that includes cesarian (2011) and Cesarean section.   Objective:   Vitals: BP 110/65 (BP Location: Right Arm, Patient Position: Sitting, Cuff Size: Normal)   Pulse 89   Temp 98.9 F (37.2 C) (Oral)   Resp 18   Wt 160 lb 12.8 oz  (72.9 kg)   SpO2 97%   BMI 29.41 kg/m   Physical Exam Vitals signs reviewed.  Constitutional:      General: She is not in acute distress. HENT:     Head: Normocephalic.     Right Ear: Tympanic membrane and ear canal normal. No middle ear effusion.     Left Ear: Tympanic membrane and ear canal normal.  No middle ear effusion.     Nose: Congestion (moderate) and rhinorrhea (clear nasal drainage) present.     Mouth/Throat:     Mouth: Mucous membranes are moist.     Pharynx: Uvula midline. Pharyngeal swelling, oropharyngeal exudate, posterior oropharyngeal erythema and uvula swelling present.     Tonsils: Tonsillar exudate present. No tonsillar abscesses. Swelling: 1+ on the right. 1+ on the left.  Eyes:     Conjunctiva/sclera: Conjunctivae normal.  Neck:     Musculoskeletal: Normal range of motion and neck supple.  Cardiovascular:     Rate and Rhythm: Normal rate and regular rhythm.     Heart sounds: Normal heart sounds.  Pulmonary:     Effort: Pulmonary effort is normal. No respiratory distress.     Breath sounds: Normal breath sounds. No stridor. No wheezing, rhonchi or rales.  Abdominal:     General: Bowel sounds are normal.     Palpations: Abdomen is soft.     Tenderness: There is no abdominal tenderness.  Lymphadenopathy:     Cervical: Cervical adenopathy (bilateral superficial) present.  Skin:    General: Skin is warm and dry.     Capillary Refill: Capillary refill takes less than 2 seconds.     Findings: No rash.  Neurological:  General: No focal deficit present.     Mental Status: She is alert and oriented to person, place, and time.  Psychiatric:        Mood and Affect: Mood normal.        Behavior: Behavior normal.   Centor Score: Tonsillar Exudate: 1 Fever/ Hx of fever >38 degrees Celsius: 1 Tender anterior cervical lymphadenopathy: 1 Absence of cough: 1 Age 80-44 y.o.: 0 Total: 3  Results for orders placed or performed in visit on 04/25/18 (from the  past 24 hour(s))  POCT Influenza A/B     Status: Normal   Collection Time: 04/25/18  5:13 PM  Result Value Ref Range   Influenza A, POC Negative Negative   Influenza B, POC Negative Negative  POCT rapid strep A     Status: Abnormal   Collection Time: 04/25/18  5:13 PM  Result Value Ref Range   Rapid Strep A Screen Positive (A) Negative    Assessment and Plan :   Exam findings, diagnosis etiology and medication use and indications reviewed with patient. Follow- Up and discharge instructions provided. No emergent/urgent issues found on exam. Patient's physical exam and positive strep test are cause for treatment with Amoxicillin. Patient also has a Centor Score of 3  Patient was also provided symptomatic treatment to include Lidocaine mouthwash. The patient will remain home until she has been on the antibiotic for at least 24 hours. The patient does not display hot potato voice, drooling or difficulty speaking.  Patient education was provided. Patient verbalized understanding of information provided and agrees with plan of care (POC), all questions answered. The patient is advised to call or return to clinic if condition does not see an improvement in symptoms, or to seek the care of the closest emergency department if condition worsens with the above plan.   1. Flu-like symptoms  - POCT Influenza A/B  2. Sore throat  - POCT rapid strep A  3. Strep throat  - amoxicillin (AMOXIL) 875 MG tablet; Take 1 tablet (875 mg total) by mouth 2 (two) times daily.  Dispense: 20 tablet; Refill: 0 - lidocaine (XYLOCAINE) 2 % solution; Use as directed 5 mLs in the mouth or throat every 6 (six) hours as needed for up to 5 days. Gargle and swallow 63mL up to 4 times daily.  Dispense: 100 mL; Refill: 0 -Take medication as prescribed. -Ibuprofen or Tylenol for pain, fever, or general discomfort. -Increase fluids. -Get plenty of rest. -Warm saltwater gargles 3-4 times daily until symptoms improve. -Sleep  elevated on at least 2 pillows at bedtime to help with cough. -Use a humidifier or vaporizer when at home and during sleep to help with cough. -May use a teaspoon of honey or over-the-counter cough drops to help with cough and sore throat -Follow-up if symptoms do not improve.

## 2018-04-27 ENCOUNTER — Telehealth: Payer: Self-pay

## 2018-04-27 NOTE — Telephone Encounter (Signed)
LM on pt vm tcb regarding how she is feeling since her visit with us. 

## 2018-05-24 MED FILL — ESCITALOPRAM 20 MG TABLET: 20 | 30 days supply | Qty: 30 | Fill #3

## 2018-07-11 MED FILL — ESCITALOPRAM 20 MG TABLET: 20 | 30 days supply | Qty: 30 | Fill #0

## 2018-08-20 MED FILL — ESCITALOPRAM 20 MG TABLET: 20 | 30 days supply | Qty: 30 | Fill #1

## 2018-10-01 MED FILL — ESCITALOPRAM 20 MG TABLET: 20 | 30 days supply | Qty: 30 | Fill #0

## 2018-11-01 MED FILL — ESCITALOPRAM 20 MG TABLET: 20 | 90 days supply | Qty: 90 | Fill #0

## 2019-02-05 MED FILL — ESCITALOPRAM 20 MG TABLET: 20 | 90 days supply | Qty: 90 | Fill #1

## 2019-05-31 MED FILL — ESCITALOPRAM 20 MG TABLET: 20 | 30 days supply | Qty: 30 | Fill #0

## 2019-06-03 ENCOUNTER — Telehealth: Payer: Self-pay | Admitting: Emergency Medicine

## 2019-06-03 DIAGNOSIS — J069 Acute upper respiratory infection, unspecified: Secondary | ICD-10-CM

## 2019-06-03 NOTE — Progress Notes (Signed)
Based on what you shared with me, I feel your condition warrants further evaluation and I recommend that you be seen for a face to face visit.  Please contact your primary care physician practice to be seen. Many offices offer virtual options to be seen via video if you are not comfortable going in person to a medical facility at this time.  If you do not have a PCP, [facility] offers a free physician referral service available at [phone]. Our trained staff has the experience, knowledge and resources to put you in touch with a physician who is right for you.   You also have the option of a video visit through [email or web address]  If you are having a true medical emergency please call 911.  NOTE: If you entered your credit card information for this eVisit, you will not be charged. You may see a "hold" on your card for the $35 but that hold will drop off and you will not have a charge processed.  Your e-visit answers were reviewed by a board certified advanced clinical practitioner to complete your personal care plan.  Thank you for using e-Visits.  Greater than 5 but less than 10 minutes spent researching, coordinating, and implementing care for this patient today  

## 2019-07-05 MED FILL — ESCITALOPRAM 20 MG TABLET: 20 | 30 days supply | Qty: 30 | Fill #1

## 2019-07-08 ENCOUNTER — Ambulatory Visit: Payer: Self-pay | Admitting: Surgery

## 2019-07-08 NOTE — H&P (Signed)
Wendy Perez Appointment: 07/08/2019 11:30 AM Location: [facility] Patient #: [id number] DOB: 1980/09/22 Undefined / Language: [place] / Race: White Female  History of Present Illness Wendy Perez A. Wendy Trussell MD; 07/08/2019 11:45 AM) Patient words: Patient presents for evaluation of a painful left gluteal mass. The mass is about walled but sized. She noticed it this year after she lost about 35 pounds. History of falling down steps on that left gluteal region one year ago. Has had chronic pain there but the mass became more noticeable when she lost weight. She was seen by dermatology and referred here. The mass is mobile and painful when she sits on it. She has no radiation of pain down the leg. There is no redness or drainage.  The patient is a 39 year old female.   Past Surgical History Wendy Perez, [place]; 07/08/2019 11:27 AM) Cesarean Section - 1 Oral Surgery  Diagnostic Studies History Wendy Perez, [place]; 07/08/2019 11:27 AM) Colonoscopy never Mammogram never Pap Smear 1-5 years ago  Allergies Wendy Perez, CMA; 07/08/2019 11:28 AM) Sulfa Drugs Doxycycline HyclateAllergies Reconciled  Medication History Wendy Perez, CMA; 07/08/2019 11:28 AM) Escitalopram Oxalate (20MG  Tablet, Oral) Active. Medications Reconciled  Social History , Wendy Perez; 07/08/2019 11:27 AM) Alcohol use Occasional alcohol use. No caffeine use No drug use Tobacco use Never smoker.  Family History 07/10/2019, Wendy Perez; 07/08/2019 11:27 AM) Breast Cancer Mother. Hypertension Father.  Pregnancy / Birth History 07/10/2019, Wendy Perez; 07/08/2019 11:27 AM) Age at menarche 11 years. Contraceptive History Intrauterine device, Oral contraceptives. Gravida 3 Length (months) of breastfeeding 12-24 Maternal age 52-30 Para 2 Regular periods     Review of Systems (Wendy Perez A. Wendy Cayabyab MD; 07/08/2019 11:45 AM) General Not Present- Appetite Loss, Chills,  Fatigue, Fever, Night Sweats, Weight Gain and Weight Loss. Skin Not Present- Change in Wart/Mole, Dryness, Hives, Jaundice, New Lesions, Non-Healing Wounds, Rash and Ulcer. HEENT Not Present- Earache, Hearing Loss, Hoarseness, Nose Bleed, Oral Ulcers, Ringing in the Ears, Seasonal Allergies, Sinus Pain, Sore Throat, Visual Disturbances, Wears glasses/contact lenses and Yellow Eyes. Respiratory Not Present- Bloody sputum, Chronic Cough, Difficulty Breathing, Snoring and Wheezing. Breast Not Present- Breast Mass, Breast Pain, Nipple Discharge and Skin Changes. Cardiovascular Not Present- Chest Pain, Difficulty Breathing Lying Down, Leg Cramps, Palpitations, Rapid Heart Rate, Shortness of Breath and Swelling of Extremities. Gastrointestinal Not Present- Abdominal Pain, Bloating, Bloody Stool, Change in Bowel Habits, Chronic diarrhea, Constipation, Difficulty Swallowing, Excessive gas, Gets full quickly at meals, Hemorrhoids, Indigestion, Nausea, Rectal Pain and Vomiting. Female Genitourinary Not Present- Frequency, Nocturia, Painful Urination, Pelvic Pain and Urgency. Musculoskeletal Not Present- Back Pain, Joint Pain, Joint Stiffness, Muscle Pain, Muscle Weakness and Swelling of Extremities. Neurological Not Present- Decreased Memory, Fainting, Headaches, Numbness, Seizures, Tingling, Tremor, Trouble walking and Weakness. Psychiatric Not Present- Anxiety, Bipolar, Change in Sleep Pattern, Depression, Fearful and Frequent crying. Endocrine Not Present- Cold Intolerance, Excessive Hunger, Hair Changes, Heat Intolerance, Hot flashes and New Diabetes. Hematology Not Present- Blood Thinners, Easy Bruising, Excessive bleeding, Gland problems, HIV and Persistent Infections. All other systems negative  Vitals 07/10/2019 CMA; 07/08/2019 11:28 AM) 07/08/2019 11:27 AM Weight: 131 lb Height: 62in Body Surface Area: 1.6 m Body Mass Index: 23.96 kg/m  Temp.: 98.32F  Pulse: 84 (Regular)  BP:  120/80(Sitting, Left Arm, Standard)        Physical Exam (Wendy Perez A. Wendy Comella MD; 07/08/2019 11:46 AM)  General Mental Status-Alert. General Appearance-Consistent with stated age. Hydration-Well hydrated. Voice-Normal.  Integumentary Note: Left buttock shows a mobile 5 cm  mass consistent with fat necrosis. There is no redness.  Chest and Lung Exam Chest and lung exam reveals -quiet, even and easy respiratory effort with no use of accessory muscles and on auscultation, normal breath sounds, no adventitious sounds and normal vocal resonance. Inspection Chest Wall - Normal. Back - normal.  Cardiovascular Cardiovascular examination reveals -normal heart sounds, regular rate and rhythm with no murmurs and normal pedal pulses bilaterally.  Abdomen Inspection Inspection of the abdomen reveals - No Hernias. Skin - Scar - no surgical scars. Palpation/Percussion Palpation and Percussion of the abdomen reveal - Soft, Non Tender, No Rebound tenderness, No Rigidity (guarding) and No hepatosplenomegaly. Auscultation Auscultation of the abdomen reveals - Bowel sounds normal.  Neurologic Neurologic evaluation reveals -alert and oriented x 3 with no impairment of recent or remote memory. Mental Status-Normal.  Musculoskeletal Normal Exam - Left-Upper Extremity Strength Normal and Lower Extremity Strength Normal. Normal Exam - Right-Upper Extremity Strength Normal and Lower Extremity Strength Normal.    Assessment & Plan (Wendy Perez A. Wendy Dicostanzo MD; 07/08/2019 11:46 AM)  NODULAR FAT NECROSIS (M79.89) Impression: LEFT BUTTOCK PT DESIRES EXCISION DUE TO PAIN Discussed risk of surgery of bleeding, infection, recurrence, cosmesis, wound care and long-term recovery and expectation.  Current Plans Pt Education - [facility] Free Text Education/Instructions: discussed with patient and provided information. The pathophysiology of skin & subcutaneous masses was discussed. Natural  history risks without surgery were discussed. I recommended surgery to remove the mass. I explained the technique of removal with use of local anesthesia & possible need for more aggressive sedation/anesthesia for patient comfort.  Risks such as bleeding, infection, wound breakdown, heart attack, death, and other risks were discussed. I noted a good likelihood this will help address the problem. Possibility that this will not correct all symptoms was explained. Possibility of regrowth/recurrence of the mass was discussed. We will work to minimize complications. Questions were answered. The patient expresses understanding & wishes to proceed with surgery.  Pt Education - [facility] Education - Written Instructions given: discussed with patient and provided information.

## 2019-08-20 ENCOUNTER — Telehealth: Payer: Self-pay | Admitting: Emergency Medicine

## 2019-08-20 DIAGNOSIS — N760 Acute vaginitis: Secondary | ICD-10-CM

## 2019-08-20 NOTE — Progress Notes (Signed)
We are sorry that you are not feeling well. Here is how we plan to help! Based on what you shared with me it looks like you: May have a yeast vaginosis  Vaginosis is an inflammation of the vagina that can result in discharge, itching and pain. The cause is usually a change in the normal balance of vaginal bacteria or an infection. Vaginosis can also result from reduced estrogen levels after menopause.  The most common causes of vaginosis are:   Bacterial vaginosis which results from an overgrowth of one on several organisms that are normally present in your vagina.   Yeast infections which are caused by a naturally occurring fungus called candida.   Vaginal atrophy (atrophic vaginosis) which results from the thinning of the vagina from reduced estrogen levels after menopause.   Trichomoniasis which is caused by a parasite and is commonly transmitted by sexual intercourse.  Factors that increase your risk of developing vaginosis include: . Medications, such as antibiotics and steroids . Uncontrolled diabetes . Use of hygiene products such as bubble bath, vaginal spray or vaginal deodorant . Douching . Wearing damp or tight-fitting clothing . Using an intrauterine device (IUD) for birth control . Hormonal changes, such as those associated with pregnancy, birth control pills or menopause . Sexual activity . Having a sexually transmitted infection  Your treatment plan is A single Diflucan (fluconazole) 150mg tablet once.  I have electronically sent this prescription into the pharmacy that you have chosen.  Be sure to take all of the medication as directed. Stop taking any medication if you develop a rash, tongue swelling or shortness of breath. Mothers who are breast feeding should consider pumping and discarding their breast milk while on these antibiotics. However, there is no consensus that infant exposure at these doses would be harmful.  Remember that medication creams can weaken latex  condoms. .   HOME CARE:  Good hygiene may prevent some types of vaginosis from recurring and may relieve some symptoms:  . Avoid baths, hot tubs and whirlpool spas. Rinse soap from your outer genital area after a shower, and dry the area well to prevent irritation. Don't use scented or harsh soaps, such as those with deodorant or antibacterial action. . Avoid irritants. These include scented tampons and pads. . Wipe from front to back after using the toilet. Doing so avoids spreading fecal bacteria to your vagina.  Other things that may help prevent vaginosis include:  . Don't douche. Your vagina doesn't require cleansing other than normal bathing. Repetitive douching disrupts the normal organisms that reside in the vagina and can actually increase your risk of vaginal infection. Douching won't clear up a vaginal infection. . Use a latex condom. Both female and female latex condoms may help you avoid infections spread by sexual contact. . Wear cotton underwear. Also wear pantyhose with a cotton crotch. If you feel comfortable without it, skip wearing underwear to bed. Yeast thrives in moist environments Your symptoms should improve in the next day or two.  GET HELP RIGHT AWAY IF:  . You have pain in your lower abdomen ( pelvic area or over your ovaries) . You develop nausea or vomiting . You develop a fever . Your discharge changes or worsens . You have persistent pain with intercourse . You develop shortness of breath, a rapid pulse, or you faint.  These symptoms could be signs of problems or infections that need to be evaluated by a medical provider now.  MAKE SURE YOU      Understand these instructions.  Will watch your condition.  Will get help right away if you are not doing well or get worse.  Your e-visit answers were reviewed by a board certified advanced clinical practitioner to complete your personal care plan. Depending upon the condition, your plan could have included  both over the counter or prescription medications. Please review your pharmacy choice to make sure that you have choses a pharmacy that is open for you to pick up any needed prescription, Your safety is important to us. If you have drug allergies check your prescription carefully.   You can use MyChart to ask questions about today's visit, request a non-urgent call back, or ask for a work or school excuse for 24 hours related to this e-Visit. If it has been greater than 24 hours you will need to follow up with your provider, or enter a new e-Visit to address those concerns. You will get a MyChart message within the next two days asking about your experience. I hope that your e-visit has been valuable and will speed your recovery.  **Please do not respond to this message unless you have follow up questions.** Greater than 5 but less than 10 minutes spent researching, coordinating, and implementing care for this patient today  

## 2019-08-22 ENCOUNTER — Telehealth: Payer: Self-pay | Admitting: Nurse Practitioner

## 2019-08-22 DIAGNOSIS — N760 Acute vaginitis: Secondary | ICD-10-CM

## 2019-08-22 MED ORDER — FLUCONAZOLE 150 MG PO TABS: 150.0000 mg | ORAL_TABLET | Freq: Every day | ORAL | 0 refills | Status: DC

## 2019-08-22 MED FILL — FLUCONAZOLE 150 MG TABS: 150 | 3 days supply | Qty: 2 | Fill #0

## 2019-08-22 NOTE — Progress Notes (Signed)
Patient did evisit yesterday and meds were not called in for vaginal candidiasis. She sent a reply to that evisit and still never had meds called in. She resubmitted evisit today- will not charge for this visit.  Meds ordered this encounter  Medications  . fluconazole (DIFLUCAN) 150 MG tablet    Sig: Take 1 tablet (150 mg total) by mouth daily. Take second dose 72 hours later if symptoms still persists.    Dispense:  2 tablet    Refill:  0    Order Specific Question:   Supervising Provider    Answer:   Eber Hong [[id number]]

## 2019-08-28 MED FILL — ESCITALOPRAM 20 MG TABLET: 20 | 30 days supply | Qty: 30 | Fill #0

## 2019-10-22 MED FILL — ESCITALOPRAM 20 MG TABLET: 20 | 30 days supply | Qty: 30 | Fill #0

## 2019-11-25 ENCOUNTER — Other Ambulatory Visit (HOSPITAL_COMMUNITY): Payer: Self-pay

## 2019-12-11 ENCOUNTER — Other Ambulatory Visit (HOSPITAL_COMMUNITY): Payer: Self-pay | Admitting: Obstetrics and Gynecology

## 2019-12-11 MED FILL — ESCITALOPRAM 20 MG TABLET: 20 | 30 days supply | Qty: 30 | Fill #0

## 2019-12-16 ENCOUNTER — Other Ambulatory Visit (HOSPITAL_COMMUNITY): Payer: Self-pay

## 2019-12-19 ENCOUNTER — Encounter (HOSPITAL_BASED_OUTPATIENT_CLINIC_OR_DEPARTMENT_OTHER): Admission: RE | Payer: Self-pay | Source: Home / Self Care

## 2019-12-19 ENCOUNTER — Ambulatory Visit (HOSPITAL_BASED_OUTPATIENT_CLINIC_OR_DEPARTMENT_OTHER)
Admission: RE | Admit: 2019-12-19 | Payer: No Typology Code available for payment source | Source: Home / Self Care | Admitting: Surgery

## 2019-12-19 SURGERY — EXCISION MASS
Anesthesia: General | Laterality: Left

## 2020-01-27 ENCOUNTER — Telehealth: Payer: No Typology Code available for payment source | Admitting: Family

## 2020-01-27 DIAGNOSIS — J069 Acute upper respiratory infection, unspecified: Secondary | ICD-10-CM | POA: Diagnosis not present

## 2020-01-27 MED ORDER — PREDNISONE 10 MG (21) PO TBPK: ORAL_TABLET | ORAL | 0 refills | Status: DC

## 2020-01-27 MED ORDER — BENZONATATE 100 MG PO CAPS: 100.0000 mg | ORAL_CAPSULE | Freq: Three times a day (TID) | ORAL | 0 refills | Status: DC | PRN

## 2020-01-27 MED ORDER — FLUTICASONE PROPIONATE 50 MCG/ACT NA SUSP: 2.0000 | Freq: Every day | NASAL | 6 refills | Status: DC

## 2020-01-27 NOTE — Progress Notes (Signed)
We are sorry you are not feeling well.  Here is how we plan to help!  Based on what you have shared with me, it looks like you may have a viral upper respiratory infection.  Upper respiratory infections are caused by a large number of viruses; however, rhinovirus is the most common cause.   Symptoms vary from person to person, with common symptoms including sore throat, cough, fatigue or lack of energy and feeling of general discomfort.  A low-grade fever of up to 100.4 may present, but is often uncommon.  Symptoms vary however, and are closely related to a person's age or underlying illnesses.  The most common symptoms associated with an upper respiratory infection are nasal discharge or congestion, cough, sneezing, headache and pressure in the ears and face.  These symptoms usually persist for about 3 to 10 days, but can last up to 2 weeks.  It is important to know that upper respiratory infections do not cause serious illness or complications in most cases.    Upper respiratory infections can be transmitted from person to person, with the most common method of transmission being a person's hands.  The virus is able to live on the skin and can infect other persons for up to 2 hours after direct contact.  Also, these can be transmitted when someone coughs or sneezes; thus, it is important to cover the mouth to reduce this risk.  To keep the spread of the illness at bay, good hand hygiene is very important.  This is an infection that is most likely caused by a virus. There are no specific treatments other than to help you with the symptoms until the infection runs its course.  We are sorry you are not feeling well.  Here is how we plan to help!   For nasal congestion, you may use an oral decongestants such as Mucinex D or if you have glaucoma or high blood pressure use plain Mucinex.  Saline nasal spray or nasal drops can help and can safely be used as often as needed for congestion.  For your congestion,  I have prescribed Fluticasone nasal spray one spray in each nostril twice a day  If you do not have a history of heart disease, hypertension, diabetes or thyroid disease, prostate/bladder issues or glaucoma, you may also use Sudafed to treat nasal congestion.  It is highly recommended that you consult with a pharmacist or your primary care physician to ensure this medication is safe for you to take.     If you have a cough, you may use cough suppressants such as Delsym and Robitussin.  If you have glaucoma or high blood pressure, you can also use Coricidin HBP.   For cough I have prescribed for you A prescription cough medication called Tessalon Perles 100 mg. You may take 1-2 capsules every 8 hours as needed for cough and prednisone dose pack.   If you have a sore or scratchy throat, use a saltwater gargle-  to  teaspoon of salt dissolved in a 4-ounce to 8-ounce glass of warm water.  Gargle the solution for approximately 15-30 seconds and then spit.  It is important not to swallow the solution.  You can also use throat lozenges/cough drops and Chloraseptic spray to help with throat pain or discomfort.  Warm or cold liquids can also be helpful in relieving throat pain.  For headache, pain or general discomfort, you can use Ibuprofen or Tylenol as directed.   Some authorities believe that zinc   sprays or the use of Echinacea may shorten the course of your symptoms.   HOME CARE . Only take medications as instructed by your medical team. . Be sure to drink plenty of fluids. Water is fine as well as fruit juices, sodas and electrolyte beverages. You may want to stay away from caffeine or alcohol. If you are nauseated, try taking small sips of liquids. How do you know if you are getting enough fluid? Your urine should be a pale yellow or almost colorless. . Get rest. . Taking a steamy shower or using a humidifier may help nasal congestion and ease sore throat pain. You can place a towel over your head  and breathe in the steam from hot water coming from a faucet. . Using a saline nasal spray works much the same way. . Cough drops, hard candies and sore throat lozenges may ease your cough. . Avoid close contacts especially the very young and the elderly . Cover your mouth if you cough or sneeze . Always remember to wash your hands.   GET HELP RIGHT AWAY IF: . You develop worsening fever. . If your symptoms do not improve within 10 days . You develop yellow or green discharge from your nose over 3 days. . You have coughing fits . You develop a severe head ache or visual changes. . You develop shortness of breath, difficulty breathing or start having chest pain . Your symptoms persist after you have completed your treatment plan  MAKE SURE YOU   Understand these instructions.  Will watch your condition.  Will get help right away if you are not doing well or get worse.  Your e-visit answers were reviewed by a board certified advanced clinical practitioner to complete your personal care plan. Depending upon the condition, your plan could have included both over the counter or prescription medications. Please review your pharmacy choice. If there is a problem, you may call our nursing hot line at and have the prescription routed to another pharmacy. Your safety is important to us. If you have drug allergies check your prescription carefully.   You can use MyChart to ask questions about today's visit, request a non-urgent call back, or ask for a work or school excuse for 24 hours related to this e-Visit. If it has been greater than 24 hours you will need to follow up with your provider, or enter a new e-Visit to address those concerns. You will get an e-mail in the next two days asking about your experience.  I hope that your e-visit has been valuable and will speed your recovery. Thank you for using e-visits.  Approximately 5 minutes was spent documenting and reviewing patient's chart.       

## 2020-02-10 MED FILL — ESCITALOPRAM 20 MG TABLET: 20 | 30 days supply | Qty: 30 | Fill #1

## 2020-05-06 ENCOUNTER — Telehealth: Payer: No Typology Code available for payment source | Admitting: Physician Assistant

## 2020-05-06 DIAGNOSIS — B9689 Other specified bacterial agents as the cause of diseases classified elsewhere: Secondary | ICD-10-CM

## 2020-05-06 DIAGNOSIS — J019 Acute sinusitis, unspecified: Secondary | ICD-10-CM | POA: Diagnosis not present

## 2020-05-06 MED ORDER — AMOXICILLIN-POT CLAVULANATE 875-125 MG PO TABS: 1.0000 | ORAL_TABLET | Freq: Two times a day (BID) | ORAL | 0 refills | Status: DC

## 2020-05-06 NOTE — Progress Notes (Signed)
I have spent 5 minutes in review of e-visit questionnaire, review and updating patient chart, medical decision making and response to patient.   Valen Mascaro Cody Dorsey Charette, PA-C    

## 2020-05-06 NOTE — Progress Notes (Signed)

## 2020-05-13 ENCOUNTER — Telehealth: Payer: No Typology Code available for payment source | Admitting: Emergency Medicine

## 2020-05-13 DIAGNOSIS — N760 Acute vaginitis: Secondary | ICD-10-CM

## 2020-05-13 MED ORDER — FLUCONAZOLE 150 MG PO TABS: 150.0000 mg | ORAL_TABLET | Freq: Once | ORAL | 0 refills | Status: AC

## 2020-05-13 NOTE — Progress Notes (Signed)

## 2020-05-14 ENCOUNTER — Other Ambulatory Visit (HOSPITAL_COMMUNITY): Payer: Self-pay | Admitting: Obstetrics and Gynecology

## 2020-05-14 MED FILL — ESCITALOPRAM 20 MG TABLET: 20 | 30 days supply | Qty: 30 | Fill #0

## 2020-06-14 MED FILL — Escitalopram Oxalate Tab 20 MG (Base Equiv): ORAL | 30 days supply | Qty: 30 | Fill #0 | Status: AC

## 2020-06-15 ENCOUNTER — Other Ambulatory Visit (HOSPITAL_COMMUNITY): Payer: Self-pay

## 2020-07-03 LAB — HM PAP SMEAR

## 2020-07-08 ENCOUNTER — Other Ambulatory Visit: Payer: Self-pay | Admitting: Obstetrics and Gynecology

## 2020-07-08 DIAGNOSIS — R928 Other abnormal and inconclusive findings on diagnostic imaging of breast: Secondary | ICD-10-CM

## 2020-07-23 ENCOUNTER — Other Ambulatory Visit (HOSPITAL_COMMUNITY): Payer: Self-pay

## 2020-07-23 MED ORDER — ESCITALOPRAM OXALATE 20 MG PO TABS
1.0000 | ORAL_TABLET | Freq: Every day | ORAL | 11 refills | Status: DC
Start: 2020-07-23 — End: 2021-08-16
  Filled 2020-07-23: qty 30, 30d supply, fill #0
  Filled 2020-08-17: qty 30, 30d supply, fill #1
  Filled 2020-10-01: qty 30, 30d supply, fill #2
  Filled 2020-11-05: qty 30, 30d supply, fill #3
  Filled 2020-12-15: qty 30, 30d supply, fill #4
  Filled 2021-01-19: qty 30, 30d supply, fill #5
  Filled 2021-02-21: qty 30, 30d supply, fill #6
  Filled 2021-03-25: qty 30, 30d supply, fill #7
  Filled 2021-05-03: qty 30, 30d supply, fill #8
  Filled 2021-05-31: qty 30, 30d supply, fill #9
  Filled 2021-07-14: qty 30, 30d supply, fill #10

## 2020-07-31 ENCOUNTER — Other Ambulatory Visit: Payer: No Typology Code available for payment source

## 2020-08-05 ENCOUNTER — Telehealth: Payer: No Typology Code available for payment source | Admitting: Physician Assistant

## 2020-08-05 DIAGNOSIS — H1031 Unspecified acute conjunctivitis, right eye: Secondary | ICD-10-CM

## 2020-08-05 MED ORDER — OFLOXACIN 0.3 % OP SOLN: 1.0000 [drp] | Freq: Four times a day (QID) | OPHTHALMIC | 0 refills | Status: AC

## 2020-08-05 NOTE — Progress Notes (Signed)
E-Visit for [employer]   We are sorry that you are not feeling well.  Here is how we plan to help!  Based on what you have shared with me it looks like you have conjunctivitis.  Conjunctivitis is a common inflammatory or infectious condition of the eye that is often referred to as "pink eye".  In most cases it is contagious (viral or bacterial). However, not all conjunctivitis requires antibiotics (ex. Allergic).  We have made appropriate suggestions for you based upon your presentation.  I have prescribed Oflaxacin 1-2 drops 4 times a day times 5 days   Pink eye can be highly contagious.  It is typically spread through direct contact with secretions, or contaminated objects or surfaces that one may have touched.  Strict handwashing is suggested with soap and water is urged.  If not available, use alcohol based had sanitizer.  Avoid unnecessary touching of the eye.  If you wear contact lenses, you will need to refrain from wearing them until you see no white discharge from the eye for at least 24 hours after being on medication.  You should see symptom improvement in 1-2 days after starting the medication regimen.  Call us if symptoms are not improved in 1-2 days.  Home Care: Wash your hands often! Do not wear your contacts until you complete your treatment plan. Avoid sharing towels, bed linen, personal items with a person who has pink eye. See attention for anyone in your home with similar symptoms.  Get Help Right Away If: Your symptoms do not improve. You develop blurred or loss of vision. Your symptoms worsen (increased discharge, pain or redness)  Your e-visit answers were reviewed by a board certified advanced clinical practitioner to complete your personal care plan.  Depending on the condition, your plan could have included both over the counter or prescription medications.  If there is a problem please reply  once you have received a response from your provider.  Your safety is  important to [place].  If you have drug allergies check your prescription carefully.    You can use MyChart to ask questions about today's visit, request a non-urgent call back, or ask for a work or school excuse for 24 hours related to this e-Visit. If it has been greater than 24 hours you will need to follow up with your provider, or enter a new e-Visit to address those concerns.   You will get an e-mail in the next two days asking about your experience.  I hope that your e-visit has been valuable and will speed your recovery. Thank you for using e-visits.

## 2020-08-05 NOTE — Progress Notes (Signed)
I have spent 5 minutes in review of e-visit questionnaire, review and updating patient chart, medical decision making and response to patient.   Owenn Rothermel Cody Saba Neuman, PA-C    

## 2020-08-18 ENCOUNTER — Other Ambulatory Visit (HOSPITAL_COMMUNITY): Payer: Self-pay

## 2020-10-02 ENCOUNTER — Other Ambulatory Visit (HOSPITAL_COMMUNITY): Payer: Self-pay

## 2020-11-05 ENCOUNTER — Other Ambulatory Visit (HOSPITAL_COMMUNITY): Payer: Self-pay

## 2020-11-17 ENCOUNTER — Telehealth: Payer: No Typology Code available for payment source | Admitting: Family

## 2020-11-17 DIAGNOSIS — B3731 Acute candidiasis of vulva and vagina: Secondary | ICD-10-CM

## 2020-11-18 MED ORDER — FLUCONAZOLE 150 MG PO TABS: 150.0000 mg | ORAL_TABLET | ORAL | 0 refills | Status: DC | PRN

## 2020-11-18 NOTE — Progress Notes (Signed)

## 2020-12-15 ENCOUNTER — Other Ambulatory Visit (HOSPITAL_COMMUNITY): Payer: Self-pay

## 2020-12-22 ENCOUNTER — Telehealth: Payer: No Typology Code available for payment source | Admitting: Physician Assistant

## 2020-12-22 DIAGNOSIS — J069 Acute upper respiratory infection, unspecified: Secondary | ICD-10-CM

## 2020-12-23 MED ORDER — BENZONATATE 100 MG PO CAPS
100.0000 mg | ORAL_CAPSULE | Freq: Three times a day (TID) | ORAL | 0 refills | Status: DC | PRN
Start: 2020-12-23 — End: 2021-02-10

## 2020-12-23 MED ORDER — FLUTICASONE PROPIONATE 50 MCG/ACT NA SUSP: 2.0000 | Freq: Every day | NASAL | 0 refills | Status: DC

## 2020-12-23 NOTE — Progress Notes (Signed)
I have spent 5 minutes in review of e-visit questionnaire, review and updating patient chart, medical decision making and response to patient.   Shellia Hartl Cody Branae Crail, PA-C    

## 2020-12-23 NOTE — Progress Notes (Signed)

## 2021-01-20 ENCOUNTER — Other Ambulatory Visit (HOSPITAL_COMMUNITY): Payer: Self-pay

## 2021-02-10 ENCOUNTER — Telehealth: Payer: No Typology Code available for payment source | Admitting: Physician Assistant

## 2021-02-10 DIAGNOSIS — U071 COVID-19: Secondary | ICD-10-CM

## 2021-02-10 MED ORDER — IPRATROPIUM BROMIDE 0.03 % NA SOLN: 2.0000 | Freq: Two times a day (BID) | NASAL | 0 refills | Status: DC

## 2021-02-10 MED ORDER — ALBUTEROL SULFATE HFA 108 (90 BASE) MCG/ACT IN AERS: 2.0000 | INHALATION_SPRAY | Freq: Four times a day (QID) | RESPIRATORY_TRACT | 0 refills | Status: DC | PRN

## 2021-02-10 MED ORDER — PSEUDOEPH-BROMPHEN-DM 30-2-10 MG/5ML PO SYRP: 5.0000 mL | ORAL_SOLUTION | Freq: Four times a day (QID) | ORAL | 0 refills | Status: DC | PRN

## 2021-02-10 MED ORDER — BENZONATATE 100 MG PO CAPS: 100.0000 mg | ORAL_CAPSULE | Freq: Three times a day (TID) | ORAL | 0 refills | Status: DC | PRN

## 2021-02-10 NOTE — Progress Notes (Signed)
E-Visit  for Positive Covid Test Result  We are sorry you are not feeling well. We are here to help!  You have tested positive for COVID-19, meaning that you were infected with the novel coronavirus and could give the virus to others.  It is vitally important that you stay home so you do not spread it to others.      Please continue isolation at home, for at least 10 days since the start of your symptoms and until you have had 24 hours with no fever (without taking a fever reducer) and with improving of symptoms.  If you have no symptoms but tested positive (or all symptoms resolve after 5 days and you have no fever) you can leave your house but continue to wear a mask around others for an additional 5 days. If you have a fever,continue to stay home until you have had 24 hours of no fever. Most cases improve 5-10 days from onset but we have seen a small number of patients who have gotten worse after the 10 days.  Please be sure to watch for worsening symptoms and remain taking the proper precautions.   Go to the nearest hospital ED for assessment if fever/cough/breathlessness are severe or illness seems like a threat to life.    The following symptoms may appear 2-14 days after exposure: Fever Cough Shortness of breath or difficulty breathing Chills Repeated shaking with chills Muscle pain Headache Sore throat New loss of taste or smell Fatigue Congestion or runny nose Nausea or vomiting Diarrhea  You have been enrolled in MyChart Home Monitoring for COVID-19. Daily you will receive a questionnaire within the MyChart website. Our COVID-19 response team will be monitoring your responses daily.  You can use medication such as prescription cough medication called Tessalon Perles 100 mg. You may take 1-2 capsules every 8 hours as needed for cough and  prescription inhaler called Albuterol MDI 90 mcg /actuation 2 puffs every 4 hours as needed for shortness of breath, wheezing, cough;  Ipratropium Bromide nasal spray for congestion and drainage; Bromfed DM cough syrup for cough and congestion.  You may also take acetaminophen (Tylenol) as needed for fever.  HOME CARE: Only take medications as instructed by your medical team. Drink plenty of fluids and get plenty of rest. A steam or ultrasonic humidifier can help if you have congestion.   GET HELP RIGHT AWAY IF YOU HAVE EMERGENCY WARNING SIGNS.  Call 911 or proceed to your closest emergency facility if: You develop worsening high fever. Trouble breathing Bluish lips or face Persistent pain or pressure in the chest New confusion Inability to wake or stay awake You cough up blood. Your symptoms become more severe Inability to hold down food or fluids  This list is not all possible symptoms. Contact your medical provider for any symptoms that are severe or concerning to you.    Your e-visit answers were reviewed by a board certified advanced clinical practitioner to complete your personal care plan.  Depending on the condition, your plan could have included both over the counter or prescription medications.  If there is a problem please reply once you have received a response from your provider.  Your safety is important to [place].  If you have drug allergies check your prescription carefully.    You can use MyChart to ask questions about today's visit, request a non-urgent call back, or ask for a work or school excuse for 24 hours related to this e-Visit. If it has  been greater than 24 hours you will need to follow up with your provider, or enter a new e-Visit to address those concerns. You will get an e-mail in the next two days asking about your experience.  I hope that your e-visit has been valuable and will speed your recovery. Thank you for using e-visits.  I provided 5 minutes of non face-to-face time during this encounter for chart review and documentation.

## 2021-02-22 ENCOUNTER — Other Ambulatory Visit (HOSPITAL_COMMUNITY): Payer: Self-pay

## 2021-03-04 ENCOUNTER — Other Ambulatory Visit: Payer: Self-pay | Admitting: Oncology

## 2021-03-04 DIAGNOSIS — U071 COVID-19: Secondary | ICD-10-CM

## 2021-03-06 ENCOUNTER — Other Ambulatory Visit (HOSPITAL_COMMUNITY): Payer: Self-pay

## 2021-03-06 MED ORDER — BUPROPION HCL ER (SR) 150 MG PO TB12
150.0000 mg | ORAL_TABLET | Freq: Two times a day (BID) | ORAL | 11 refills | Status: DC
  Filled 2021-03-06: qty 60, 30d supply, fill #0
  Filled 2021-05-13: qty 60, 30d supply, fill #1
  Filled 2021-07-14: qty 60, 30d supply, fill #2
  Filled 2021-09-20: qty 60, 30d supply, fill #3

## 2021-03-08 ENCOUNTER — Other Ambulatory Visit (HOSPITAL_COMMUNITY): Payer: Self-pay

## 2021-03-25 ENCOUNTER — Other Ambulatory Visit (HOSPITAL_COMMUNITY): Payer: Self-pay

## 2021-03-29 ENCOUNTER — Other Ambulatory Visit (HOSPITAL_COMMUNITY): Payer: Self-pay

## 2021-04-21 ENCOUNTER — Telehealth: Payer: No Typology Code available for payment source | Admitting: Physician Assistant

## 2021-04-21 ENCOUNTER — Other Ambulatory Visit (HOSPITAL_COMMUNITY): Payer: Self-pay

## 2021-04-21 DIAGNOSIS — J329 Chronic sinusitis, unspecified: Secondary | ICD-10-CM | POA: Diagnosis not present

## 2021-04-21 DIAGNOSIS — B9689 Other specified bacterial agents as the cause of diseases classified elsewhere: Secondary | ICD-10-CM

## 2021-04-21 MED ORDER — AMOXICILLIN-POT CLAVULANATE 875-125 MG PO TABS
1.0000 | ORAL_TABLET | Freq: Two times a day (BID) | ORAL | 0 refills | Status: DC
  Filled 2021-04-21: qty 14, 7d supply, fill #0

## 2021-04-21 MED ORDER — FLUTICASONE PROPIONATE 50 MCG/ACT NA SUSP
2.0000 | Freq: Every day | NASAL | 0 refills | Status: DC
  Filled 2021-04-21: qty 16, 30d supply, fill #0

## 2021-04-21 NOTE — Progress Notes (Signed)
E-Visit for Sinus Problems ° °We are sorry that you are not feeling well.  Here is how we plan to help! ° °Based on what you have shared with me it looks like you have sinusitis.  Sinusitis is inflammation and infection in the sinus cavities of the head.  Based on your presentation I believe you most likely have Acute Bacterial Sinusitis.  This is an infection caused by bacteria and is treated with antibiotics. I have prescribed Augmentin 875mg/125mg one tablet twice daily with food, for 7 days. You may use an oral decongestant such as Mucinex D or if you have glaucoma or high blood pressure use plain Mucinex. Saline nasal spray help and can safely be used as often as needed for congestion.  If you develop worsening sinus pain, fever or notice severe headache and vision changes, or if symptoms are not better after completion of antibiotic, please schedule an appointment with a health care provider.   ° °Sinus infections are not as easily transmitted as other respiratory infection, however we still recommend that you avoid close contact with loved ones, especially the very young and elderly.  Remember to wash your hands thoroughly throughout the day as this is the number one way to prevent the spread of infection! ° °Home Care: °Only take medications as instructed by your medical team. °Complete the entire course of an antibiotic. °Do not take these medications with alcohol. °A steam or ultrasonic humidifier can help congestion.  You can place a towel over your head and breathe in the steam from hot water coming from a faucet. °Avoid close contacts especially the very young and the elderly. °Cover your mouth when you cough or sneeze. °Always remember to wash your hands. ° °Get Help Right Away If: °You develop worsening fever or sinus pain. °You develop a severe head ache or visual changes. °Your symptoms persist after you have completed your treatment plan. ° °Make sure you °Understand these instructions. °Will watch  your condition. °Will get help right away if you are not doing well or get worse. ° °Thank you for choosing an e-visit. ° °Your e-visit answers were reviewed by a board certified advanced clinical practitioner to complete your personal care plan. Depending upon the condition, your plan could have included both over the counter or prescription medications. ° °Please review your pharmacy choice. Make sure the pharmacy is open so you can pick up prescription now. If there is a problem, you may contact your provider through MyChart messaging and have the prescription routed to another pharmacy.  Your safety is important to us. If you have drug allergies check your prescription carefully.  ° °For the next 24 hours you can use MyChart to ask questions about today's visit, request a non-urgent call back, or ask for a work or school excuse. °You will get an email in the next two days asking about your experience. I hope that your e-visit has been valuable and will speed your recovery. ° °Greater than 5 minutes, yet less than 10 minutes of time have been spent researching, coordinating, and implementing care for this patient today ° °

## 2021-05-03 ENCOUNTER — Other Ambulatory Visit (HOSPITAL_COMMUNITY): Payer: Self-pay

## 2021-05-06 ENCOUNTER — Telehealth: Payer: No Typology Code available for payment source | Admitting: Family Medicine

## 2021-05-06 DIAGNOSIS — B379 Candidiasis, unspecified: Secondary | ICD-10-CM | POA: Diagnosis not present

## 2021-05-06 DIAGNOSIS — T3695XA Adverse effect of unspecified systemic antibiotic, initial encounter: Secondary | ICD-10-CM

## 2021-05-06 MED ORDER — FLUCONAZOLE 150 MG PO TABS
150.0000 mg | ORAL_TABLET | Freq: Once | ORAL | 0 refills | Status: AC
Start: 2021-05-06 — End: 2021-05-06

## 2021-05-06 NOTE — Progress Notes (Signed)

## 2021-05-13 ENCOUNTER — Other Ambulatory Visit (HOSPITAL_COMMUNITY): Payer: Self-pay

## 2021-05-31 ENCOUNTER — Other Ambulatory Visit (HOSPITAL_COMMUNITY): Payer: Self-pay

## 2021-07-02 ENCOUNTER — Other Ambulatory Visit (HOSPITAL_COMMUNITY): Payer: Self-pay

## 2021-07-02 MED ORDER — FLUCONAZOLE 150 MG PO TABS
150.0000 mg | ORAL_TABLET | ORAL | 1 refills | Status: DC
  Filled 2021-07-02: qty 2, 2d supply, fill #0

## 2021-07-14 ENCOUNTER — Other Ambulatory Visit (HOSPITAL_COMMUNITY): Payer: Self-pay

## 2021-08-16 ENCOUNTER — Other Ambulatory Visit (HOSPITAL_COMMUNITY): Payer: Self-pay

## 2021-08-16 MED ORDER — ESCITALOPRAM OXALATE 20 MG PO TABS
20.0000 mg | ORAL_TABLET | Freq: Every day | ORAL | 0 refills | Status: DC
  Filled 2021-08-16: qty 30, 30d supply, fill #0

## 2021-09-20 ENCOUNTER — Other Ambulatory Visit (HOSPITAL_COMMUNITY): Payer: Self-pay

## 2021-09-21 ENCOUNTER — Other Ambulatory Visit (HOSPITAL_COMMUNITY): Payer: Self-pay

## 2021-09-22 ENCOUNTER — Other Ambulatory Visit (HOSPITAL_COMMUNITY): Payer: Self-pay

## 2021-09-22 MED ORDER — ESCITALOPRAM OXALATE 20 MG PO TABS
20.0000 mg | ORAL_TABLET | Freq: Every day | ORAL | 0 refills | Status: DC
  Filled 2021-09-22: qty 30, 30d supply, fill #0

## 2021-10-17 ENCOUNTER — Telehealth: Payer: No Typology Code available for payment source | Admitting: Nurse Practitioner

## 2021-10-17 DIAGNOSIS — B9689 Other specified bacterial agents as the cause of diseases classified elsewhere: Secondary | ICD-10-CM | POA: Diagnosis not present

## 2021-10-17 DIAGNOSIS — J329 Chronic sinusitis, unspecified: Secondary | ICD-10-CM | POA: Diagnosis not present

## 2021-10-17 MED ORDER — AMOXICILLIN-POT CLAVULANATE 875-125 MG PO TABS: 1.0000 | ORAL_TABLET | Freq: Two times a day (BID) | ORAL | 0 refills | Status: DC

## 2021-10-17 NOTE — Progress Notes (Signed)

## 2021-10-17 NOTE — Progress Notes (Signed)
I have spent 5 minutes in review of e-visit questionnaire, review and updating patient chart, medical decision making and response to patient.  ° °Jacquel Mccamish W Edna Rede, NP ° °  °

## 2021-10-25 ENCOUNTER — Telehealth: Payer: No Typology Code available for payment source | Admitting: Physician Assistant

## 2021-10-25 DIAGNOSIS — B379 Candidiasis, unspecified: Secondary | ICD-10-CM | POA: Diagnosis not present

## 2021-10-25 DIAGNOSIS — T3695XA Adverse effect of unspecified systemic antibiotic, initial encounter: Secondary | ICD-10-CM

## 2021-10-26 ENCOUNTER — Other Ambulatory Visit (HOSPITAL_COMMUNITY): Payer: Self-pay

## 2021-10-26 MED ORDER — FLUCONAZOLE 150 MG PO TABS: ORAL_TABLET | ORAL | 0 refills | Status: DC

## 2021-10-26 NOTE — Progress Notes (Signed)

## 2021-10-26 NOTE — Progress Notes (Signed)
I have spent 5 minutes in review of e-visit questionnaire, review and updating patient chart, medical decision making and response to patient.   Gyasi Hazzard Cody Dariya Gainer, PA-C    

## 2021-10-27 ENCOUNTER — Other Ambulatory Visit (HOSPITAL_COMMUNITY): Payer: Self-pay

## 2021-10-27 MED ORDER — ESCITALOPRAM OXALATE 20 MG PO TABS
20.0000 mg | ORAL_TABLET | Freq: Every day | ORAL | 1 refills | Status: DC
  Filled 2021-10-27: qty 30, 30d supply, fill #0

## 2021-10-28 ENCOUNTER — Encounter (HOSPITAL_BASED_OUTPATIENT_CLINIC_OR_DEPARTMENT_OTHER): Payer: Self-pay | Admitting: Nurse Practitioner

## 2021-10-28 ENCOUNTER — Other Ambulatory Visit (HOSPITAL_BASED_OUTPATIENT_CLINIC_OR_DEPARTMENT_OTHER): Payer: Self-pay

## 2021-10-28 ENCOUNTER — Other Ambulatory Visit (HOSPITAL_COMMUNITY): Payer: Self-pay

## 2021-10-28 ENCOUNTER — Ambulatory Visit (INDEPENDENT_AMBULATORY_CARE_PROVIDER_SITE_OTHER): Payer: No Typology Code available for payment source | Admitting: Nurse Practitioner

## 2021-10-28 VITALS — BP 127/77 | HR 69 | Ht 62.0 in | Wt 118.2 lb

## 2021-10-28 DIAGNOSIS — F339 Major depressive disorder, recurrent, unspecified: Secondary | ICD-10-CM

## 2021-10-28 DIAGNOSIS — Z23 Encounter for immunization: Secondary | ICD-10-CM

## 2021-10-28 DIAGNOSIS — R1031 Right lower quadrant pain: Secondary | ICD-10-CM | POA: Diagnosis not present

## 2021-10-28 DIAGNOSIS — F902 Attention-deficit hyperactivity disorder, combined type: Secondary | ICD-10-CM

## 2021-10-28 DIAGNOSIS — Z0000 Encounter for general adult medical examination without abnormal findings: Secondary | ICD-10-CM

## 2021-10-28 MED ORDER — METHYLPHENIDATE HCL ER (OSM) 18 MG PO TBCR
18.0000 mg | EXTENDED_RELEASE_TABLET | Freq: Every day | ORAL | 0 refills | Status: DC
  Filled 2021-10-28: qty 30, 30d supply, fill #0

## 2021-10-28 MED ORDER — METHYLPHENIDATE HCL 20 MG PO TABS
10.0000 mg | ORAL_TABLET | Freq: Every day | ORAL | 0 refills | Status: DC
  Filled 2021-10-28: qty 30, 30d supply, fill #0

## 2021-10-28 MED ORDER — ESCITALOPRAM OXALATE 20 MG PO TABS
20.0000 mg | ORAL_TABLET | Freq: Every day | ORAL | 3 refills | Status: DC
  Filled 2021-10-28 (×2): qty 90, 90d supply, fill #0
  Filled 2022-01-25: qty 90, 90d supply, fill #1
  Filled 2022-04-18: qty 30, 30d supply, fill #2
  Filled 2022-05-26: qty 30, 30d supply, fill #3
  Filled 2022-07-03: qty 30, 30d supply, fill #4
  Filled 2022-08-05: qty 30, 30d supply, fill #0
  Filled 2022-09-06: qty 30, 30d supply, fill #1
  Filled 2022-10-10: qty 30, 30d supply, fill #2

## 2021-10-28 MED ORDER — BUPROPION HCL ER (SR) 150 MG PO TB12
150.0000 mg | ORAL_TABLET | Freq: Two times a day (BID) | ORAL | 3 refills | Status: DC
  Filled 2021-10-28: qty 180, 90d supply, fill #0
  Filled 2022-04-18: qty 60, 30d supply, fill #1
  Filled 2022-05-26: qty 60, 30d supply, fill #2
  Filled 2022-07-03: qty 60, 30d supply, fill #3
  Filled 2022-08-05: qty 60, 30d supply, fill #0
  Filled 2022-09-06: qty 60, 30d supply, fill #1
  Filled 2022-10-10: qty 60, 30d supply, fill #2
  Filled 2022-10-10: qty 60, 30d supply, fill #0

## 2021-10-28 NOTE — Patient Instructions (Addendum)
Thank you for choosing [place] at [facility] for your Primary Care needs. I am excited for the opportunity to partner with you to meet your health care goals. It was a pleasure meeting you today!  Recommendations from today's visit: I have sent in the Concerta (long acting ritalin) to take in the morning to see how this helps with ADHD. I have also sent in 96m short acting tablets for you to take in the afternoon as needed if the morning medication wears off.  I have sent a referral to orthopedics for the groin pain and arm I will send in the referral for therapy services for you. I sent in the wellbutrin and lexapro for you for 90 days.   Information on diet, exercise, and health maintenance recommendations are listed below. This is information to help you be sure you are on track for optimal health and monitoring.   Please look over this and let [place] if you have any questions or if you have completed any of the health maintenance outside of [place] that we can be sure your records are up to date.  ___________________________________________________________ About Me: I am an Adult-Geriatric Nurse Practitioner with a background in caring for patients for more than 20 years with a strong intensive care background. I provide primary care and sports medicine services to patients age 5955and older within this office. My education had a strong focus on caring for the older adult population, which I am passionate about. I am also the director of the APP Fellowship with [facility]   My desire is to provide you with the best service through preventive medicine and supportive care. I consider you a part of the medical team and value your input. I work diligently to ensure that you are heard and your needs are met in a safe and effective manner. I want you to feel comfortable with me as your provider and want you to know that your health concerns are important to me.  For  your information, our office hours are: Monday, Tuesday, and Thursday 8:00 AM - 5:00 PM Wednesday and Friday 8:00 AM - 12:00 PM.   In my time away from the office I am teaching new APP's within the system and am unavailable, but my partner, Dr. dBurnard Buntingis in the office for emergent needs.   If you have questions or concerns, please call our office at [phone] send [facility] MyChart message and we will respond as quickly as possible.  ____________________________________________________________ MyChart:  For all urgent or time sensitive needs we ask that you please call the office to avoid delays. Our number is [phone]. MyChart is not constantly monitored and due to the large volume of messages a day, replies may take up to 72 business hours.  MyChart Policy: MyChart allows for you to see your visit notes, after visit summary, provider recommendations, lab and tests results, make an appointment, request refills, and contact your provider or the office for non-urgent questions or concerns. Providers are seeing patients during normal business hours and do not have built in time to review MyChart messages.  We ask that you allow a minimum of 3 business days for responses to [employer] For this reason, please do not send urgent requests through [place] Please call the office at [phone] New and ongoing conditions may require a visit. We have virtual and in person visit available for your convenience.  Complex MyChart concerns may require a visit.  Your provider may request you schedule a virtual or in person visit to ensure we are providing the best care possible. MyChart messages sent after 11:00 AM on Friday will not be received by the provider until Monday morning.    Lab and Test Results: You will receive your lab and test results on MyChart as soon as they are completed and results have been sent by the lab or testing facility. Due to this service, you will receive your  results BEFORE your provider.  I review lab and tests results each morning prior to seeing patients. Some results require collaboration with other providers to ensure you are receiving the most appropriate care. For this reason, we ask that you please allow a minimum of 3-5 business days from the time the ALL results have been received for your provider to receive and review lab and test results and contact you about these.  Most lab and test result comments from the provider will be sent through [place]. Your provider may recommend changes to the plan of care, follow-up visits, repeat testing, ask questions, or request an office visit to discuss these results. You may reply directly to this message or call the office at [phone] to provide information for the provider or set up an appointment. In some instances, you will be called with test results and recommendations. Please let us know if this is preferred and we will make note of this in your chart to provide this for you.    If you have not heard a response to your lab or test results in 5 business days from all results returning to [place], please call the office to let us know. We ask that you please avoid calling prior to this time unless there is an emergent concern. Due to high call volumes, this can delay the resulting process.  After Hours: For all non-emergency after hours needs, please call the office at [phone] and select the option to reach the on-call provider service. On-call services are shared between multiple [place] offices and therefore it will not be possible to speak directly with your provider. On-call providers may provide medical advice and recommendations, but are unable to provide refills for maintenance medications.  For all emergency or urgent medical needs after normal business hours, we recommend that you seek care at the closest Urgent Care or Emergency Department to ensure appropriate treatment in a timely  manner.  [facility] [place] has a 24 hour emergency room located on the ground floor for your convenience.   Urgent Concerns During the Business Day Providers are seeing patients from 8AM to [place] with a busy schedule and are most often not able to respond to non-urgent calls until the end of the day or the next business day. If you should have URGENT concerns during the day, please call and speak to the nurse or schedule a same day appointment so that we can address your concern without delay.   Thank you, again, for choosing me as your health care partner. I appreciate your trust and look forward to learning more about you.   Worthy Keeler, DNP, AGNP-c ___________________________________________________________  Health Maintenance Recommendations Screening Testing Mammogram Every 1 -2 years based on history and risk factors Starting at age 35 Pap Smear Ages 21-39 every 3 years Ages 77-65 every 5 years with HPV testing More frequent testing may be required based on results and history Colon Cancer Screening Every 1-10 years based on test performed, risk factors, and history  Starting at age 61 Bone Density Screening Every 2-10 years based on history Starting at age 30 for women Recommendations for men differ based on medication usage, history, and risk factors AAA Screening One time ultrasound Men 67-79 years old who have every smoked Lung Cancer Screening Low Dose Lung CT every 12 months Age 67-80 years with a 30 pack-year smoking history who still smoke or who have quit within the last 15 years  Screening Labs Routine  Labs: Complete Blood Count (CBC), Complete Metabolic Panel (CMP), Cholesterol (Lipid Panel) Every 6-12 months based on history and medications May be recommended more frequently based on current conditions or previous results Hemoglobin A1c Lab Every 3-12 months based on history and previous results Starting at age 55 or earlier with diagnosis  of diabetes, high cholesterol, BMI >26, and/or risk factors Frequent monitoring for patients with diabetes to ensure blood sugar control Thyroid Panel (TSH w/ T3 & T4) Every 6 months based on history, symptoms, and risk factors May be repeated more often if on medication HIV One time testing for all patients 46 and older May be repeated more frequently for patients with increased risk factors or exposure Hepatitis C One time testing for all patients 24 and older May be repeated more frequently for patients with increased risk factors or exposure Gonorrhea, Chlamydia Every 12 months for all sexually active persons 13-24 years Additional monitoring may be recommended for those who are considered high risk or who have symptoms PSA Men 5-38 years old with risk factors Additional screening may be recommended from age 697-69 based on risk factors, symptoms, and history  Vaccine Recommendations Tetanus Booster All adults every 10 years Flu Vaccine All patients 6 months and older every year COVID Vaccine All patients 12 years and older Initial dosing with booster May recommend additional booster based on age and health history HPV Vaccine 2 doses all patients age 69-26 Dosing may be considered for patients over 26 Shingles Vaccine (Shingrix) 2 doses all adults 59 years and older Pneumonia (Pneumovax 23) All adults 82 years and older May recommend earlier dosing based on health history Pneumonia (Prevnar 83) All adults 71 years and older Dosed 1 year after Pneumovax 23  Additional Screening, Testing, and Vaccinations may be recommended on an individualized basis based on family history, health history, risk factors, and/or exposure.  __________________________________________________________  Diet Recommendations for All Patients  I recommend that all patients maintain a diet low in saturated fats, carbohydrates, and cholesterol. While this can be challenging at first, it is not  impossible and small changes can make big differences.  Things to try: Decreasing the amount of soda, sweet tea, and/or juice to one or less per day and replace with water While water is always the first choice, if you do not like water you may consider adding a water additive without sugar to improve the taste other sugar free drinks Replace potatoes with a brightly colored vegetable at dinner Use healthy oils, such as canola oil or olive oil, instead of butter or hard margarine Limit your bread intake to two pieces or less a day Replace regular pasta with low carb pasta options Bake, broil, or grill foods instead of frying Monitor portion sizes  Eat smaller, more frequent meals throughout the day instead of large meals  An important thing to remember is, if you love foods that are not great for your health, you don't have to give them up completely. Instead, allow these foods to be a reward when you  have done well. Allowing yourself to still have special treats every once in a while is a nice way to tell yourself thank you for working hard to keep yourself healthy.   Also remember that every day is a new day. If you have a bad day and "fall off the wagon", you can still climb right back up and keep moving along on your journey!  We have resources available to help you!  Some websites that may be helpful include: www.http://carter.biz/  Www.VeryWellFit.com _____________________________________________________________  Activity Recommendations for All Patients  I recommend that all adults get at least 20 minutes of moderate physical activity that elevates your heart rate at least 5 days out of the week.  Some examples include: Walking or jogging at a pace that allows you to carry on a conversation Cycling (stationary bike or outdoors) Water aerobics Yoga Weight lifting Dancing If physical limitations prevent you from putting stress on your joints, exercise in a pool or seated in a chair  are excellent options.  Do determine your MAXIMUM heart rate for activity: YOUR AGE - 220 = MAX HeartRate   Remember! Do not push yourself too hard.  Start slowly and build up your pace, speed, weight, time in exercise, etc.  Allow your body to rest between exercise and get good sleep. You will need more water than normal when you are exerting yourself. Do not wait until you are thirsty to drink. Drink with a purpose of getting in at least 8, 8 ounce glasses of water a day plus more depending on how much you exercise and sweat.    If you begin to develop dizziness, chest pain, abdominal pain, jaw pain, shortness of breath, headache, vision changes, lightheadedness, or other concerning symptoms, stop the activity and allow your body to rest. If your symptoms are severe, seek emergency evaluation immediately. If your symptoms are concerning, but not severe, please let us know so that we can recommend further evaluation.

## 2021-10-28 NOTE — Progress Notes (Signed)
Orma Render, DNP, AGNP-c [facility] [address]  [place], [place] [address] [phone]  New patient visit   Patient: Wendy Perez   DOB: [birth year]   41 y.o. Female  MRN: [id number] Visit Date: 10/28/2021  Patient Care Team: Patient, No Pcp Per as PCP - General (General Practice)  Today's Vitals   10/28/21 0912  BP: 127/77  Pulse: 69  SpO2: 99%  Weight: 118 lb 3.2 oz (53.6 kg)  Height: _0  (1.575 m)   Body mass index is 21.62 kg/m.   Today's healthcare provider: Orma Render, NP   Chief Complaint  Patient presents with   New Patient (Initial Visit)    Patient presents to establish care. Patient is due for flu and would like that today while in office.    Subjective    Wendy Perez is a 41 y.o. female who presents today as a new patient to establish care.    Patient endorses the following concerns presently:  Wendy Perez- 48 years old- daughter  ADHD diagnosis in 1st grade - took ritalin from 1st grade through college She tells me she could manage better as an adult and stopped the medications She tells me that as she has gotten older she has noticed difficulty focusing and staying on task at home and into her work.  She tells me her brain feels scattered and foggy many times. She tells me she really feels like she is to the point that she is unable to get anything accomplished or even started.   In January she split from her partner for 4years and he and his children moved out. She reports this has been a very difficult time for her and she has had increased periods of crying and sadness. She endorses that she quit drinking when her partner left and admits that she did go through symptoms of DT's for several days, but once she made it through that she reports that she is feeling very good. She denies desire to drink again and tells me that she was drinking daily at the time she quit.    Groin pain About 6  weeks ago right groin pull from sitting in chair with leg under her and heard a pop in the groin.  She has been doing PT exercises at home and it does feel a little better, but it is still significantly decreased in ROM. She tells me that sometimes she notices pain the way she moves or sits.   She also has intermittent right arm nerve type pain in the posterior upper arm. Unsure what causes this or why it started.   History reviewed and reveals the following: Past Medical History:  Diagnosis Date   Medical history non-contributory    Past Surgical History:  Procedure Laterality Date   CESAREAN SECTION     cesarian  2011   Family Status  Relation Name Status   Father  (Not Specified)   Family History  Problem Relation Age of Onset   Hypertension Father    Social History   Socioeconomic History   Marital status: Divorced    Spouse name: Not on file   Number of children: Not on file   Years of education: Not on file   Highest education level: Not on file  Occupational History   Not on file  Tobacco Use   Smoking status: Never    Passive exposure: Never   Smokeless tobacco: Never  Substance  and Sexual Activity   Alcohol use: No   Drug use: No   Sexual activity: Yes  Other Topics Concern   Not on file  Social History Narrative   Not on file   Social Determinants of Health   Financial Resource Strain: Not on file  Food Insecurity: Not on file  Transportation Needs: Not on file  Physical Activity: Not on file  Stress: Not on file  Social Connections: Not on file   Outpatient Medications Prior to Visit  Medication Sig   albuterol (VENTOLIN HFA) 108 (90 Base) MCG/ACT inhaler TAKE 2 PUFFS BY MOUTH EVERY 6 HOURS AS NEEDED FOR WHEEZE OR SHORTNESS OF BREATH   fluticasone (FLONASE) 50 MCG/ACT nasal spray Place 2 sprays into both nostrils daily.   ipratropium (ATROVENT) 0.03 % nasal spray Place 2 sprays into both nostrils every 12 (twelve) hours.   levonorgestrel (MIRENA,  52 MG,) 20 MCG/DAY IUD Privided by care center   [DISCONTINUED] buPROPion (WELLBUTRIN SR) 150 MG 12 hr tablet Take 1 tablet (150 mg total) by mouth 2 (two) times daily.   [DISCONTINUED] escitalopram (LEXAPRO) 20 MG tablet Take 1 tablet (20 mg total) by mouth daily.   [DISCONTINUED] amoxicillin-clavulanate (AUGMENTIN) 875-125 MG tablet Take 1 tablet by mouth 2 (two) times daily for 7 days (Patient not taking: Reported on 10/28/2021)   [DISCONTINUED] benzonatate (TESSALON) 100 MG capsule Take 1 capsule (100 mg total) by mouth 3 (three) times daily as needed. (Patient not taking: Reported on 10/28/2021)   [DISCONTINUED] brompheniramine-pseudoephedrine-DM 30-2-10 MG/5ML syrup Take 5 mLs by mouth 4 (four) times daily as needed. (Patient not taking: Reported on 10/28/2021)   [DISCONTINUED] fluconazole (DIFLUCAN) 150 MG tablet Take 1 tablet PO once. Repeat in 3 days if needed. (Patient not taking: Reported on 10/28/2021)   [DISCONTINUED] ibuprofen (ADVIL,MOTRIN) 200 MG tablet Take 200 mg by mouth every 6 (six) hours as needed. (Patient not taking: Reported on 10/28/2021)   No facility-administered medications prior to visit.   Allergies  Allergen Reactions   Doxycycline Nausea And Vomiting   Sulfa Antibiotics Hives    Reaction hives   Immunization History  Administered Date(s) Administered   Hep B, Unspecified 07/16/1991   Influenza,inj,Quad PF,6+ Mos 10/28/2021   Influenza-Unspecified 10/15/2012, 11/14/2012   MMR 04/15/1979   Tdap 03/17/2006, 04/14/2013, 06/19/2013    Review of Systems All review of systems negative except what is listed in the HPI   Objective    BP 127/77   Pulse 69   Ht _0  (1.575 m)   Wt 118 lb 3.2 oz (53.6 kg)   SpO2 99%   BMI 21.62 kg/m  Physical Exam Vitals and nursing note reviewed.  Constitutional:      General: She is not in acute distress.    Appearance: Normal appearance.  Eyes:     Extraocular Movements: Extraocular movements intact.      Conjunctiva/sclera: Conjunctivae normal.     Pupils: Pupils are equal, round, and reactive to light.  Neck:     Vascular: No carotid bruit.  Cardiovascular:     Rate and Rhythm: Normal rate and regular rhythm.     Pulses: Normal pulses.     Heart sounds: Normal heart sounds. No murmur heard. Pulmonary:     Effort: Pulmonary effort is normal.     Breath sounds: Normal breath sounds. No wheezing.  Abdominal:     General: Bowel sounds are normal.     Palpations: Abdomen is soft.  Musculoskeletal:  General: Normal range of motion.     Cervical back: Normal range of motion.     Right lower leg: No edema.     Left lower leg: No edema.  Skin:    General: Skin is warm and dry.     Capillary Refill: Capillary refill takes less than 2 seconds.  Neurological:     General: No focal deficit present.     Mental Status: She is alert and oriented to person, place, and time.  Psychiatric:        Mood and Affect: Mood normal.        Behavior: Behavior normal.        Thought Content: Thought content normal.        Judgment: Judgment normal.     No results found for any visits on 10/28/21.  Assessment & Plan      Problem List Items Addressed This Visit     Attention deficit hyperactivity disorder (ADHD), combined type - Primary    Chronic ADHD with diagnosis in first grade.  She has been off of medication for quite some time however she is now experiencing difficulty with attention and focus and starting and completing tasks.  Expressed caution in patient with history of alcohol dependence to monitor very closely her use of medication as stimulant medication can be habit-forming as well.  We will plan to start with Ritalin 20 mg daily and monitor closely.  We will also send referral for counseling which may also be helpful for depression and ADHD symptoms.      Relevant Medications   methylphenidate (CONCERTA) 18 MG PO CR tablet   methylphenidate (RITALIN) 20 MG tablet   Other  Relevant Orders   Ambulatory referral to Psychology   Right groin pain    Right-sided groin pain for approximately 6 weeks.  Symptoms are improving however she is still experiencing discomfort and decreased range of motion.  She has not been seen for this formally.  She has been doing PT exercises at home and it does feel a little better however she does have considerable discomfort based on the way she moves or sits.  Option to place referral for orthopedics for further evaluation today and she is in agreement to this.      Relevant Orders   AMB referral to orthopedics   Depression, recurrent ([place])    Symptoms and presentation consistent with depression.  Patient has recently split from her long-term partner which has understandably caused an exacerbation in her symptoms.  She tells me that she has quit drinking and when she looks back she feels like she was self-medicating with the alcohol prior to the split.  She is interested in starting medication at this time and counseling.  We will plan to start she is currently on Lexapro daily but does not feel that this is effective enough for her therefore we will plan to add Wellbutrin to see if we can get improved control of her symptoms.  I am hopeful this will also help with her ADHD and give her the additional focus that she needs.  We will plan to follow-up in the next 4 weeks to see how she is doing with the current medication regimen.  If it anytime she has any new or worsening symptoms she is aware to contact the office.      Relevant Medications   escitalopram (LEXAPRO) 20 MG tablet   buPROPion (WELLBUTRIN SR) 150 MG 12 hr tablet   Other Relevant Orders  Ambulatory referral to Psychology   Flu vaccine need   Relevant Orders   Flu Vaccine QUAD 6+ mos PF IM (Fluarix Quad PF) (Completed)   Encounter for medical examination to establish care    New patient to establish care today.  Review of medical history, medication, and labs.  Health  maintenance updated.  Anticipatory guidance provided.  We will plan to follow-up for CPE in the near future.        Return for 3 months ADHD- virtual.      Wendy Perez, Coralee Pesa, NP, DNP, AGNP-C [facility] [place] Maintenance Due Health Maintenance Topics with due status: Overdue     Topic Date Due   Hepatitis C Screening Never done   PAP SMEAR-Modifier Never done

## 2021-11-03 ENCOUNTER — Ambulatory Visit (INDEPENDENT_AMBULATORY_CARE_PROVIDER_SITE_OTHER): Payer: No Typology Code available for payment source

## 2021-11-03 ENCOUNTER — Ambulatory Visit (INDEPENDENT_AMBULATORY_CARE_PROVIDER_SITE_OTHER): Payer: No Typology Code available for payment source | Admitting: Orthopaedic Surgery

## 2021-11-03 DIAGNOSIS — M25551 Pain in right hip: Secondary | ICD-10-CM

## 2021-11-03 DIAGNOSIS — S73191A Other sprain of right hip, initial encounter: Secondary | ICD-10-CM | POA: Diagnosis not present

## 2021-11-03 NOTE — Progress Notes (Signed)
Chief Complaint: Right hip pain, right arm pain     History of Present Illness:    Wendy Perez is a 41 y.o. female presents today with right hip and groin pain that has been occurring since an injury 2 months ago where she went to put her knee down on a chair and felt a pop as well as heard an audible pop.  Since this time she has had quite limited range of motion about the hip.  She has been working with a close friend who is a physical therapist on strengthening exercises for 2 months with somewhat improved motion but still mechanical type symptoms with pain in the hip.  It is shooting down the anterior aspect of the hip.  She is overall very active but this has been quite limited as result of her injury.  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  With regard to the right shoulder she is experiencing posterior shoulder pain that has been going on for the last several months.  This is intermittent.  She denied any specific injury.  She is taking Motrin for both this and her hip.    Surgical History:   None  PMH/PSH/Family History/Social History/Meds/Allergies:    Past Medical History:  Diagnosis Date   Medical history non-contributory    Past Surgical History:  Procedure Laterality Date   CESAREAN SECTION     cesarian  2011   Social History   Socioeconomic History   Marital status: Divorced    Spouse name: Not on file   Number of children: Not on file   Years of education: Not on file   Highest education level: Not on file  Occupational History   Not on file  Tobacco Use   Smoking status: Never    Passive exposure: Never   Smokeless tobacco: Never  Substance and Sexual Activity   Alcohol use: No   Drug use: No   Sexual activity: Yes  Other Topics Concern   Not on file  Social History Narrative   Not on file   Social Determinants of Health   Financial Resource Strain: Not on file  Food  Insecurity: Not on file  Transportation Needs: Not on file  Physical Activity: Not on file  Stress: Not on file  Social Connections: Not on file   Family History  Problem Relation Age of Onset   Hypertension Father    Allergies  Allergen Reactions   Doxycycline Nausea And Vomiting   Sulfa Antibiotics Hives    Reaction hives   Current Outpatient Medications  Medication Sig Dispense Refill   albuterol (VENTOLIN HFA) 108 (90 Base) MCG/ACT inhaler TAKE 2 PUFFS BY MOUTH EVERY 6 HOURS AS NEEDED FOR WHEEZE OR SHORTNESS OF BREATH 8.5 each 1   buPROPion (WELLBUTRIN SR) 150 MG 12 hr tablet Take 1 tablet (150 mg total) by mouth 2 (two) times daily. 180 tablet 3   escitalopram (LEXAPRO) 20 MG tablet Take 1 tablet (20 mg total) by mouth daily. 90 tablet 3   fluticasone (FLONASE) 50 MCG/ACT nasal spray Place 2 sprays into both nostrils daily. 16 g 0   ipratropium (ATROVENT) 0.03 % nasal spray Place 2 sprays into both nostrils every 12 (twelve) hours. 30 mL 0   levonorgestrel (MIRENA, 52 MG,)  20 MCG/DAY IUD Privided by care center     methylphenidate (CONCERTA) 18 MG PO CR tablet Take 1 tablet (18 mg total) by mouth daily. 30 tablet 0   methylphenidate (RITALIN) 20 MG tablet Take 0.5-1 tablets (10-20 mg total) by mouth daily in the afternoon as needed for focus 30 tablet 0   No current facility-administered medications for this visit.   No results found.  Review of Systems:   A ROS was performed including pertinent positives and negatives as documented in the HPI.  Physical Exam :   Constitutional: NAD and appears stated age Neurological: Alert and oriented Psych: Appropriate affect and cooperative unknown if currently breastfeeding.   Comprehensive Musculoskeletal Exam:    Inspection Right Left  Skin No atrophy or gross abnormalities appreciated No atrophy or gross abnormalities appreciated  Palpation    Tenderness None None  Crepitus None None  Range of Motion    Flexion (passive)  120 120  Extension 30 30  IR 30 with pain 30  ER 45 45  Strength    Flexion  5/5 5/5  Extension 5/5 5/5  Special Tests    FABER Negative Negative  FADIR Negative Negative  ER Lag/Capsular Insufficiency Negative Negative  Instability Negative Negative  Sacroiliac pain Negative  Negative   Instability    Generalized Laxity No No  Neurologic    sciatic, femoral, obturator nerves intact to light sensation  Vascular/Lymphatic    DP pulse 2+ 2+  Lumbar Exam    Patient has symmetric lumbar range of motion with negative pain referral to hip     Imaging:   Xray (right hip 4 views): There is an os acetabuli which appears to be mildly displaced  I personally reviewed and interpreted the radiographs.   Assessment:   41 y.o. female with concern for right hip labral tear after traumatic injury 2 months ago where she heard an audible pop.  Since this time her hip is been persistently painful.  She has been working on a home exercise program and strengthening with physical therapy for 2 months.  This has not provided any type of significant relief.  I have recommended that we pursue an MRI arthrogram in order to assess if she does have an underlying labral tear.  We will plan to proceed with this and see her back following  Plan :    -Plan for MRI arthrogram right hip follow-up to discuss results     I personally saw and evaluated the patient, and participated in the management and treatment plan.  Vanetta Mulders, MD Attending Physician, Orthopedic Surgery  This document was dictated using Dragon voice recognition software. A reasonable attempt at proof reading has been made to minimize errors.

## 2021-11-10 ENCOUNTER — Encounter (HOSPITAL_BASED_OUTPATIENT_CLINIC_OR_DEPARTMENT_OTHER): Payer: Self-pay | Admitting: Orthopaedic Surgery

## 2021-11-11 ENCOUNTER — Other Ambulatory Visit (HOSPITAL_BASED_OUTPATIENT_CLINIC_OR_DEPARTMENT_OTHER): Payer: Self-pay | Admitting: Orthopaedic Surgery

## 2021-11-11 MED ORDER — METHOCARBAMOL 500 MG PO TABS: 500.0000 mg | ORAL_TABLET | Freq: Four times a day (QID) | ORAL | 0 refills | Status: AC

## 2021-11-11 MED ORDER — METHYLPREDNISOLONE 4 MG PO TBPK: ORAL_TABLET | ORAL | 0 refills | Status: DC

## 2021-11-12 ENCOUNTER — Ambulatory Visit (INDEPENDENT_AMBULATORY_CARE_PROVIDER_SITE_OTHER): Payer: No Typology Code available for payment source

## 2021-11-12 ENCOUNTER — Ambulatory Visit (INDEPENDENT_AMBULATORY_CARE_PROVIDER_SITE_OTHER): Payer: No Typology Code available for payment source | Admitting: Orthopaedic Surgery

## 2021-11-12 DIAGNOSIS — G8929 Other chronic pain: Secondary | ICD-10-CM

## 2021-11-12 DIAGNOSIS — M25561 Pain in right knee: Secondary | ICD-10-CM

## 2021-11-12 DIAGNOSIS — M25511 Pain in right shoulder: Secondary | ICD-10-CM | POA: Diagnosis not present

## 2021-11-12 MED ORDER — LIDOCAINE HCL 1 % IJ SOLN
4.0000 mL | INTRAMUSCULAR | Status: AC | PRN
  Administered 2021-11-12: 4 mL

## 2021-11-12 MED ORDER — TRIAMCINOLONE ACETONIDE 40 MG/ML IJ SUSP
80.0000 mg | INTRAMUSCULAR | Status: AC | PRN
  Administered 2021-11-12: 80 mg via INTRA_ARTICULAR

## 2021-11-12 NOTE — Progress Notes (Signed)
Chief Complaint: Right shoulder pain     History of Present Illness:    Wendy Perez is a 41 y.o. female right-hand-dominant female presents today with right shoulder pain and deep aching pain in the joint.  There is no specific injury that led to this although she does have pain with cross body activity.  There is popping in the joint as well.  The joint if she occasionally feels like it is going to give out.  She works as a Marine scientist at [employer].    Surgical History:   None  PMH/PSH/Family History/Social History/Meds/Allergies:    Past Medical History:  Diagnosis Date  . Medical history non-contributory    Past Surgical History:  Procedure Laterality Date  . CESAREAN SECTION    . cesarian  2011   Social History   Socioeconomic History  . Marital status: Divorced    Spouse name: Not on file  . Number of children: Not on file  . Years of education: Not on file  . Highest education level: Not on file  Occupational History  . Not on file  Tobacco Use  . Smoking status: Never    Passive exposure: Never  . Smokeless tobacco: Never  Substance and Sexual Activity  . Alcohol use: No  . Drug use: No  . Sexual activity: Yes  Other Topics Concern  . Not on file  Social History Narrative  . Not on file   Social Determinants of Health   Financial Resource Strain: Not on file  Food Insecurity: Not on file  Transportation Needs: Not on file  Physical Activity: Not on file  Stress: Not on file  Social Connections: Not on file   Family History  Problem Relation Age of Onset  . Hypertension Father    Allergies  Allergen Reactions  . Doxycycline Nausea And Vomiting  . Sulfa Antibiotics Hives    Reaction hives   Current Outpatient Medications  Medication Sig Dispense Refill  . albuterol (VENTOLIN HFA) 108 (90 Base) MCG/ACT inhaler TAKE 2 PUFFS BY MOUTH EVERY 6 HOURS AS NEEDED FOR WHEEZE OR SHORTNESS OF BREATH 8.5 each 1  .  buPROPion (WELLBUTRIN SR) 150 MG 12 hr tablet Take 1 tablet (150 mg total) by mouth 2 (two) times daily. 180 tablet 3  . escitalopram (LEXAPRO) 20 MG tablet Take 1 tablet (20 mg total) by mouth daily. 90 tablet 3  . fluticasone (FLONASE) 50 MCG/ACT nasal spray Place 2 sprays into both nostrils daily. 16 g 0  . ipratropium (ATROVENT) 0.03 % nasal spray Place 2 sprays into both nostrils every 12 (twelve) hours. 30 mL 0  . levonorgestrel (MIRENA, 52 MG,) 20 MCG/DAY IUD Privided by care center    . methocarbamol (ROBAXIN) 500 MG tablet Take 1 tablet (500 mg total) by mouth 4 (four) times daily for 14 days. 56 tablet 0  . methylphenidate (CONCERTA) 18 MG PO CR tablet Take 1 tablet (18 mg total) by mouth daily. 30 tablet 0  . methylphenidate (RITALIN) 20 MG tablet Take 0.5-1 tablets (10-20 mg total) by mouth daily in the afternoon as needed for focus 30 tablet 0  . methylPREDNISolone (MEDROL DOSEPAK) 4 MG TBPK tablet Take per packet instructions 1 each 0   No current facility-administered medications for this visit.   No results found.  Review  of Systems:   A ROS was performed including pertinent positives and negatives as documented in the HPI.  Physical Exam :   Constitutional: NAD and appears stated age Neurological: Alert and oriented Psych: Appropriate affect and cooperative Last menstrual period 10/17/2021, unknown if currently breastfeeding.   Comprehensive Musculoskeletal Exam:      Musculoskeletal Exam    Inspection Right Left  Skin No atrophy or winging No atrophy or winging  Palpation    Tenderness Glenohumeral none  Range of Motion    Flexion (passive) 170 170  Flexion (active) 170 170  Abduction 170 170  ER at the side 70 70  Can reach behind back to T12 T12  Strength     Full Full  Special Tests    Pseudoparalytic No No  Neurologic    Fires PIN, radial, median, ulnar, musculocutaneous, axillary, suprascapular, long thoracic, and spinal accessory innervated muscles.  No abnormal sensibility  Vascular/Lymphatic    Radial Pulse 2+ 2+  Cervical Exam    Patient has symmetric cervical range of motion with negative Spurling's test.  Special Test: Positive O'Brien    Imaging:   Xray (3 view right shoulder, 3 views right knee, 3 views left knee): Normal knees bilaterally  I personally reviewed and interpreted the radiographs.   Assessment:   41 y.o. female with right shoulder pain consistent with a labral injury.  At this time I recommend an ultrasound-guided injection of the glenohumeral joint.  I do believe this is a high likelihood of resolving his pain completely.  Plan :    -We will plan to proceed with a right glenohumeral ultrasound-guided injection after verbal consent obtained    Procedure Note  Patient: Wendy Perez             Date of Birth: Mar 08, 1980           MRN: [id number]             Visit Date: 11/12/2021  Procedures: Visit Diagnoses:  1. Chronic right shoulder pain   2. Chronic pain of right knee     Large Joint Inj on 11/12/2021 3:08 PM Indications: pain Details: 22 G 1.5 in needle, ultrasound-guided anterior approach  Arthrogram: No  Medications: 4 mL lidocaine 1 %; 80 mg triamcinolone acetonide 40 MG/ML Outcome: tolerated well, no immediate complications Procedure, treatment alternatives, risks and benefits explained, specific risks discussed. Consent was given by the patient. Immediately prior to procedure a time out was called to verify the correct patient, procedure, equipment, support staff and site/side marked as required. Patient was prepped and draped in the usual sterile fashion.        I personally saw and evaluated the patient, and participated in the management and treatment plan.  Vanetta Mulders, MD Attending Physician, Orthopedic Surgery  This document was dictated using Dragon voice recognition software. A reasonable attempt at proof reading has been made to minimize errors.

## 2021-11-13 DIAGNOSIS — R1031 Right lower quadrant pain: Secondary | ICD-10-CM

## 2021-11-13 DIAGNOSIS — Z23 Encounter for immunization: Secondary | ICD-10-CM

## 2021-11-13 DIAGNOSIS — F902 Attention-deficit hyperactivity disorder, combined type: Secondary | ICD-10-CM | POA: Insufficient documentation

## 2021-11-13 DIAGNOSIS — F339 Major depressive disorder, recurrent, unspecified: Secondary | ICD-10-CM | POA: Insufficient documentation

## 2021-11-13 DIAGNOSIS — Z0000 Encounter for general adult medical examination without abnormal findings: Secondary | ICD-10-CM | POA: Insufficient documentation

## 2021-11-13 HISTORY — DX: Right lower quadrant pain: R10.31

## 2021-11-13 HISTORY — DX: Encounter for immunization: Z23

## 2021-11-13 NOTE — Assessment & Plan Note (Signed)
Right-sided groin pain for approximately 6 weeks.  Symptoms are improving however she is still experiencing discomfort and decreased range of motion.  She has not been seen for this formally.  She has been doing PT exercises at home and it does feel a little better however she does have considerable discomfort based on the way she moves or sits.  Option to place referral for orthopedics for further evaluation today and she is in agreement to this.

## 2021-11-13 NOTE — Assessment & Plan Note (Signed)
Chronic ADHD with diagnosis in first grade.  She has been off of medication for quite some time however she is now experiencing difficulty with attention and focus and starting and completing tasks.  Expressed caution in patient with history of alcohol dependence to monitor very closely her use of medication as stimulant medication can be habit-forming as well.  We will plan to start with Ritalin 20 mg daily and monitor closely.  We will also send referral for counseling which may also be helpful for depression and ADHD symptoms.

## 2021-11-13 NOTE — Assessment & Plan Note (Signed)
New patient to establish care today.  Review of medical history, medication, and labs.  Health maintenance updated.  Anticipatory guidance provided.  We will plan to follow-up for CPE in the near future.

## 2021-11-13 NOTE — Assessment & Plan Note (Signed)
Symptoms and presentation consistent with depression.  Patient has recently split from her long-term partner which has understandably caused an exacerbation in her symptoms.  She tells me that she has quit drinking and when she looks back she feels like she was self-medicating with the alcohol prior to the split.  She is interested in starting medication at this time and counseling.  We will plan to start she is currently on Lexapro daily but does not feel that this is effective enough for her therefore we will plan to add Wellbutrin to see if we can get improved control of her symptoms.  I am hopeful this will also help with her ADHD and give her the additional focus that she needs.  We will plan to follow-up in the next 4 weeks to see how she is doing with the current medication regimen.  If it anytime she has any new or worsening symptoms she is aware to contact the office.

## 2021-11-17 ENCOUNTER — Other Ambulatory Visit (HOSPITAL_COMMUNITY): Payer: Self-pay

## 2021-11-17 MED ORDER — MYRBETRIQ 25 MG PO TB24
25.0000 mg | ORAL_TABLET | Freq: Every day | ORAL | 12 refills | Status: DC
  Filled 2021-11-17: qty 30, 30d supply, fill #0
  Filled 2021-12-20: qty 30, 30d supply, fill #1
  Filled 2022-01-03 – 2022-01-19 (×2): qty 30, 30d supply, fill #2
  Filled 2022-02-21 (×2): qty 30, 30d supply, fill #3
  Filled 2022-04-18 – 2022-07-03 (×4): qty 30, 30d supply, fill #4

## 2021-11-22 ENCOUNTER — Other Ambulatory Visit (HOSPITAL_COMMUNITY): Payer: Self-pay

## 2021-11-24 ENCOUNTER — Ambulatory Visit
Admission: RE | Admit: 2021-11-24 | Discharge: 2021-11-24 | Disposition: A | Discharge status: Home / Self Care | Payer: No Typology Code available for payment source | Source: Ambulatory Visit | Attending: Orthopaedic Surgery | Admitting: Orthopaedic Surgery

## 2021-11-24 DIAGNOSIS — S73191A Other sprain of right hip, initial encounter: Secondary | ICD-10-CM

## 2021-11-24 LAB — HM MAMMOGRAPHY

## 2021-11-24 MED ORDER — IOPAMIDOL (ISOVUE-M 200) INJECTION 41%
13.0000 mL | Freq: Once | INTRAMUSCULAR | Status: AC
  Administered 2021-11-24: 13 mL via INTRA_ARTICULAR

## 2021-11-29 ENCOUNTER — Other Ambulatory Visit (HOSPITAL_BASED_OUTPATIENT_CLINIC_OR_DEPARTMENT_OTHER): Payer: Self-pay

## 2021-11-29 ENCOUNTER — Other Ambulatory Visit (HOSPITAL_COMMUNITY): Payer: Self-pay

## 2021-11-29 ENCOUNTER — Other Ambulatory Visit (HOSPITAL_BASED_OUTPATIENT_CLINIC_OR_DEPARTMENT_OTHER): Payer: Self-pay | Admitting: Nurse Practitioner

## 2021-11-29 DIAGNOSIS — F902 Attention-deficit hyperactivity disorder, combined type: Secondary | ICD-10-CM

## 2021-11-29 MED ORDER — METHYLPHENIDATE HCL 20 MG PO TABS
10.0000 mg | ORAL_TABLET | Freq: Every day | ORAL | 0 refills | Status: DC
  Filled 2021-11-29: qty 30, 30d supply, fill #0

## 2021-12-01 ENCOUNTER — Ambulatory Visit (INDEPENDENT_AMBULATORY_CARE_PROVIDER_SITE_OTHER): Payer: No Typology Code available for payment source | Admitting: Orthopaedic Surgery

## 2021-12-01 DIAGNOSIS — S73191A Other sprain of right hip, initial encounter: Secondary | ICD-10-CM | POA: Diagnosis not present

## 2021-12-01 DIAGNOSIS — M25551 Pain in right hip: Secondary | ICD-10-CM | POA: Diagnosis not present

## 2021-12-01 MED ORDER — TRIAMCINOLONE ACETONIDE 40 MG/ML IJ SUSP
80.0000 mg | INTRAMUSCULAR | Status: AC | PRN
  Administered 2021-12-01: 80 mg via INTRA_ARTICULAR

## 2021-12-01 MED ORDER — LIDOCAINE HCL 1 % IJ SOLN
4.0000 mL | INTRAMUSCULAR | Status: AC | PRN
  Administered 2021-12-01: 4 mL

## 2021-12-01 NOTE — Progress Notes (Signed)
Chief Complaint: Right hip pain, right arm pain     History of Present Illness:   12/01/2021: Presents today for follow-up of the right hip.  This continues to be painful with certain activities.  There are certain days where this is extremely painful with walking and activity making it very difficult for her to work.  She is here today for MRI follow-up.  She is continued at home strengthening program guided by therapist with minimal relief.  JHOANNA HEYDE is a 41 y.o. female presents today with right hip and groin pain that has been occurring since an injury 2 months ago where she went to put her knee down on a chair and felt a pop as well as heard an audible pop.  Since this time she has had quite limited range of motion about the hip.  She has been working with a close friend who is a physical therapist on strengthening exercises for 2 months with somewhat improved motion but still mechanical type symptoms with pain in the hip.  It is shooting down the anterior aspect of the hip.  She is overall very active but this has been quite limited as result of her injury.  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  With regard to the right shoulder she is experiencing posterior shoulder pain that has been going on for the last several months.  This is intermittent.  She denied any specific injury.  She is taking Motrin for both this and her hip.    Surgical History:   None  PMH/PSH/Family History/Social History/Meds/Allergies:    Past Medical History:  Diagnosis Date   Medical history non-contributory    Past Surgical History:  Procedure Laterality Date   CESAREAN SECTION     cesarian  2011   Social History   Socioeconomic History   Marital status: Divorced    Spouse name: Not on file   Number of children: Not on file   Years of education: Not on file   Highest education level: Not on file  Occupational History    Not on file  Tobacco Use   Smoking status: Never    Passive exposure: Never   Smokeless tobacco: Never  Substance and Sexual Activity   Alcohol use: No   Drug use: No   Sexual activity: Yes  Other Topics Concern   Not on file  Social History Narrative   Not on file   Social Determinants of Health   Financial Resource Strain: Not on file  Food Insecurity: Not on file  Transportation Needs: Not on file  Physical Activity: Not on file  Stress: Not on file  Social Connections: Not on file   Family History  Problem Relation Age of Onset   Hypertension Father    Allergies  Allergen Reactions   Doxycycline Nausea And Vomiting   Sulfa Antibiotics Hives    Reaction hives   Current Outpatient Medications  Medication Sig Dispense Refill   albuterol (VENTOLIN HFA) 108 (90 Base) MCG/ACT inhaler TAKE 2 PUFFS BY MOUTH EVERY 6 HOURS AS NEEDED FOR WHEEZE OR SHORTNESS OF BREATH 8.5 each 1   buPROPion (WELLBUTRIN SR) 150 MG 12 hr tablet Take 1 tablet (150 mg total) by mouth 2 (two) times daily. 180 tablet 3  escitalopram (LEXAPRO) 20 MG tablet Take 1 tablet (20 mg total) by mouth daily. 90 tablet 3   fluticasone (FLONASE) 50 MCG/ACT nasal spray Place 2 sprays into both nostrils daily. 16 g 0   ipratropium (ATROVENT) 0.03 % nasal spray Place 2 sprays into both nostrils every 12 (twelve) hours. 30 mL 0   levonorgestrel (MIRENA, 52 MG,) 20 MCG/DAY IUD Privided by care center     methylphenidate (CONCERTA) 18 MG PO CR tablet Take 1 tablet (18 mg total) by mouth daily. 30 tablet 0   methylphenidate (RITALIN) 20 MG tablet Take 0.5-1 tablets (10-20 mg total) by mouth daily in the afternoon as needed for focus 30 tablet 0   methylPREDNISolone (MEDROL DOSEPAK) 4 MG TBPK tablet Take per packet instructions 1 each 0   mirabegron ER (MYRBETRIQ) 25 MG TB24 tablet Take 1 tablet (25 mg total) by mouth daily. 30 tablet 12   No current facility-administered medications for this visit.   No results  found.  Review of Systems:   A ROS was performed including pertinent positives and negatives as documented in the HPI.  Physical Exam :   Constitutional: NAD and appears stated age Neurological: Alert and oriented Psych: Appropriate affect and cooperative Last menstrual period 10/17/2021, unknown if currently breastfeeding.   Comprehensive Musculoskeletal Exam:    Inspection Right Left  Skin No atrophy or gross abnormalities appreciated No atrophy or gross abnormalities appreciated  Palpation    Tenderness Femoral acetabular None  Crepitus None None  Range of Motion    Flexion (passive) 120 120  Extension 30 30  IR 30 with pain 30  ER 45 45  Strength    Flexion  5/5 5/5  Extension 5/5 5/5  Special Tests    FABER Negative Negative  FADIR Positive Negative  ER Lag/Capsular Insufficiency Negative Negative  Instability Negative Negative  Sacroiliac pain Negative  Negative   Instability    Generalized Laxity No No  Neurologic    sciatic, femoral, obturator nerves intact to light sensation  Vascular/Lymphatic    DP pulse 2+ 2+  Lumbar Exam    Patient has symmetric lumbar range of motion with negative pain referral to hip     Imaging:   Xray (right hip 4 views): There is an os acetabuli which appears to be mildly displaced  MRI right hip: There is an anterior superior labral tear in the setting of known os acetabuli.  Alpha angle is within normal limits.  Lateral center edge angle measures 30 degrees.  I personally reviewed and interpreted the radiographs.   Assessment:   41 y.o. female with concern for right hip labral tear after traumatic injury 2 months ago where she heard an audible pop.  At this time she continues to be persistently painful.  MRI does show evidence of a right hip labral tear.  At this time she is continue to fail a exercise strengthening program.  Side effect I have offered her an ultrasound-guided femoral acetabular injection.  We did also discuss  surgical intervention and what this would entail.  This time she would continue to like to think about the timing of this hopefully she will get prolonged relief from her injection.  I will see her back in 1 month for reassessment Plan :    -Right hip ultrasound-guided injection performed after verbal consent obtained       I personally saw and evaluated the patient, and participated in the management and treatment plan.  Vanetta Mulders, MD Attending  Physician, Orthopedic Surgery  This document was dictated using Dragon voice recognition software. A reasonable attempt at proof reading has been made to minimize errors.   Procedure Note  Patient: Wendy Perez             Date of Birth: Jan 26, 1981           MRN: [id number]             Visit Date: 12/01/2021  Procedures: Visit Diagnoses:  No diagnosis found.   Large Joint Inj: R hip joint on 12/01/2021 10:03 AM Indications: pain Details: 22 G 3.5 in needle, ultrasound-guided anterolateral approach  Arthrogram: No  Medications: 4 mL lidocaine 1 %; 80 mg triamcinolone acetonide 40 MG/ML Outcome: tolerated well, no immediate complications Procedure, treatment alternatives, risks and benefits explained, specific risks discussed. Consent was given by the patient. Immediately prior to procedure a time out was called to verify the correct patient, procedure, equipment, support staff and site/side marked as required. Patient was prepped and draped in the usual sterile fashion.        I personally saw and evaluated the patient, and participated in the management and treatment plan.  Huel Cote, MD Attending Physician, Orthopedic Surgery  This document was dictated using Dragon voice recognition software. A reasonable attempt at proof reading has been made to minimize errors.

## 2021-12-06 ENCOUNTER — Telehealth: Payer: Self-pay | Admitting: Orthopaedic Surgery

## 2021-12-06 NOTE — Telephone Encounter (Signed)
Patient has submitted FMLA forms. Please advise work status. Thank you

## 2021-12-07 ENCOUNTER — Encounter (HOSPITAL_BASED_OUTPATIENT_CLINIC_OR_DEPARTMENT_OTHER): Payer: Self-pay

## 2021-12-07 NOTE — Telephone Encounter (Signed)
Attempted CB. Sent patient a MyChart message to clarify she still wanted to postpone surgery

## 2021-12-20 ENCOUNTER — Other Ambulatory Visit (HOSPITAL_COMMUNITY): Payer: Self-pay

## 2021-12-22 ENCOUNTER — Other Ambulatory Visit (HOSPITAL_BASED_OUTPATIENT_CLINIC_OR_DEPARTMENT_OTHER): Payer: Self-pay

## 2021-12-22 MED ORDER — MELOXICAM 15 MG PO TABS
15.0000 mg | ORAL_TABLET | Freq: Every day | ORAL | 0 refills | Status: DC
  Filled 2021-12-22 – 2021-12-23 (×2): qty 45, 45d supply, fill #0

## 2021-12-23 ENCOUNTER — Other Ambulatory Visit (HOSPITAL_COMMUNITY): Payer: Self-pay

## 2021-12-23 ENCOUNTER — Other Ambulatory Visit (HOSPITAL_BASED_OUTPATIENT_CLINIC_OR_DEPARTMENT_OTHER): Payer: Self-pay

## 2021-12-24 ENCOUNTER — Other Ambulatory Visit (HOSPITAL_BASED_OUTPATIENT_CLINIC_OR_DEPARTMENT_OTHER): Payer: Self-pay

## 2021-12-28 ENCOUNTER — Ambulatory Visit: Admit: 2021-12-28 | Payer: No Typology Code available for payment source | Admitting: Orthopaedic Surgery

## 2021-12-28 SURGERY — ARTHROSCOPY HIP
Anesthesia: Regional | Site: Hip | Laterality: Right

## 2021-12-30 ENCOUNTER — Ambulatory Visit (HOSPITAL_BASED_OUTPATIENT_CLINIC_OR_DEPARTMENT_OTHER): Payer: No Typology Code available for payment source | Admitting: Orthopaedic Surgery

## 2021-12-31 ENCOUNTER — Ambulatory Visit (HOSPITAL_BASED_OUTPATIENT_CLINIC_OR_DEPARTMENT_OTHER): Payer: Self-pay | Admitting: Physical Therapy

## 2022-01-03 ENCOUNTER — Other Ambulatory Visit (HOSPITAL_BASED_OUTPATIENT_CLINIC_OR_DEPARTMENT_OTHER): Payer: Self-pay | Admitting: Nurse Practitioner

## 2022-01-03 ENCOUNTER — Ambulatory Visit (INDEPENDENT_AMBULATORY_CARE_PROVIDER_SITE_OTHER): Payer: No Typology Code available for payment source | Admitting: Behavioral Health

## 2022-01-03 ENCOUNTER — Other Ambulatory Visit (HOSPITAL_COMMUNITY): Payer: Self-pay

## 2022-01-03 DIAGNOSIS — F902 Attention-deficit hyperactivity disorder, combined type: Secondary | ICD-10-CM

## 2022-01-03 DIAGNOSIS — F418 Other specified anxiety disorders: Secondary | ICD-10-CM

## 2022-01-03 NOTE — Progress Notes (Signed)
[facility] [facility] Counselor Initial Adult Exam  Name: Wendy Perez Date: 01/03/2022 MRN: [id number] DOB: 12-Jun-1980 PCP: Patient, No Pcp Per  Time spent: 60 min In Person @ [facility]:  Self    Paperwork requested: No   Reason for Visit /Presenting Problem: Elevated anx/dep & stress due to cont'd need for psychotherapeutic options so Pt can maintain, celebrate, & prevent relapse from ETOH.  Mental Status Exam: Appearance:   Casual     Behavior:  Appropriate, Sharing, and Motivated  Motor:  Normal and Restlestness (Pt sts she did not take her Ritalin this morning)  Speech/Language:   Clear and Coherent and Normal Rate  Affect:  Appropriate and Tearful  Mood:  sad, but hopeful  Thought process:  normal  Thought content:    WNL  Sensory/Perceptual disturbances:    WNL  Orientation:  oriented to person, place, and time/date  Attention:  Good  Concentration:  Good  Memory:  WNL  Fund of knowledge:   Good  Insight:    Good  Judgment:   Good  Impulse Control:  Good; Pt trying to improve this by not agreeing so quickly w/others & reducing ppl-pleasing beh    Risk Assessment: Danger to Self:  No Self-injurious Behavior: No Danger to Others: No Duty to Warn:no Physical Aggression / Violence:No  Access to Firearms a concern: No  Gang Involvement:No  Patient / guardian was educated about steps to take if suicide or homicide risk level increases between visits: yes; appropriate resources  While future psychiatric events cannot be accurately predicted, the patient does not currently require acute inpatient psychiatric care and does not currently meet [facility] involuntary commitment criteria.  Substance Abuse History: Current substance abuse: No  Pt sts she has been free from ETOH use since Mar 14, 2021. She attended AA Mtg that was helpful for 4 mos until the Summer months when her children were home from Sch   Past Psychiatric History:    No previous psychological problems have been observed Outpatient Providers: Pt needs a PCP per MyChart History of Psych Hospitalization: No  Psychological Testing:  Pt sts she, "is so ADHD"! She disclosed her tendency to be impulsive & agree to spontaneity too often, but not when the consequences are harmful.    Abuse History:  Victim of: No.,  NA    Report needed: No. Victim of Neglect:No. Perpetrator of  NA   Witness / Exposure to Domestic Violence: No   Protective Services Involvement: No  Witness to [employer] Violence:  No   Family History:  Family History  Problem Relation Age of Onset   Hypertension Father     Living situation: the patient lives with their family  Sexual Orientation: Straight  Relationship Status: divorced  Name of spouse / other:Ex-Husb Montine Circle who is "100% involved w/his children & regrets our divorce". Pt recently lost BF Shanon Brow when he "cheated in our relationship".  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   Any cultural differences that may affect / interfere with treatment:  None noted  Recreation/Hobbies: Unk  Stressors: Loss  of innocence about Recovery & her many realizations about her own part in her life's journey.    Strengths: Supportive Relationships, Family, Friends, Journalist, newspaper, and Able to Communicate Effectively  Barriers:  None noted   Legal History: Pending legal issue / charges: The patient has no significant history of legal issues. History of legal issue / charges:  None  noted  Medical History/Surgical History: reviewed Past Medical History:  Diagnosis Date   Medical history non-contributory     Past Surgical History:  Procedure Laterality Date   CESAREAN SECTION     cesarian  2011    Medications: Current Outpatient Medications  Medication Sig Dispense Refill   albuterol (VENTOLIN HFA) 108 (90 Base) MCG/ACT inhaler TAKE 2 PUFFS BY MOUTH EVERY 6 HOURS AS NEEDED FOR WHEEZE OR SHORTNESS OF BREATH 8.5 each 1   buPROPion (WELLBUTRIN SR) 150 MG 12 hr tablet Take 1 tablet (150 mg total) by mouth 2 (two) times daily. 180 tablet 3   escitalopram (LEXAPRO) 20 MG tablet Take 1 tablet (20 mg total) by mouth daily. 90 tablet 3   fluticasone (FLONASE) 50 MCG/ACT nasal spray Place 2 sprays into both nostrils daily. 16 g 0   ipratropium (ATROVENT) 0.03 % nasal spray Place 2 sprays into both nostrils every 12 (twelve) hours. 30 mL 0   levonorgestrel (MIRENA, 52 MG,) 20 MCG/DAY IUD Privided by care center     meloxicam (MOBIC) 15 MG tablet Take 1 tablet (15 mg total) by mouth daily. 45 tablet 0   methylphenidate (CONCERTA) 18 MG PO CR tablet Take 1 tablet (18 mg total) by mouth daily. 30 tablet 0   methylphenidate (RITALIN) 20 MG tablet Take 0.5-1 tablets (10-20 mg total) by mouth daily in the afternoon as needed for focus 30 tablet 0   methylPREDNISolone (MEDROL DOSEPAK) 4 MG TBPK tablet Take per packet instructions 1 each 0   mirabegron ER (MYRBETRIQ) 25 MG TB24 tablet Take 1 tablet (25 mg total) by mouth daily. 30 tablet 12   No current facility-administered medications for this visit.    Allergies  Allergen Reactions   Doxycycline Nausea And Vomiting   Sulfa Antibiotics Hives    Reaction hives    Diagnoses:  Anxiety with depression  Plan of Care: Pt will attend psychotherapy twice monthly as scheduled.   Target Date: 02/02/2022  Progress: 0  Frequency: Twice monthly  Modality: Laressa Bolinger is struggling to come to terms currently w/her newly estb'd  recovery. She is very emot'l & wants the support of psychotherapy to assist in her stability @ this time. Jalene will keep her next appt & try to find a Sitter for her kids if needed.  Target Date: 02/02/2022  Progress: 3  Frequency: Twice monthly   Modality: Claretta Fraise, LMFT

## 2022-01-03 NOTE — Progress Notes (Signed)
                Wendy Perez L Wendy Lacina, LMFT 

## 2022-01-05 ENCOUNTER — Encounter (HOSPITAL_BASED_OUTPATIENT_CLINIC_OR_DEPARTMENT_OTHER): Payer: Self-pay | Admitting: Physical Therapy

## 2022-01-08 ENCOUNTER — Telehealth: Payer: Self-pay | Admitting: Urgent Care

## 2022-01-08 DIAGNOSIS — J0191 Acute recurrent sinusitis, unspecified: Secondary | ICD-10-CM

## 2022-01-08 MED ORDER — AMOXICILLIN-POT CLAVULANATE 875-125 MG PO TABS: 1.0000 | ORAL_TABLET | Freq: Two times a day (BID) | ORAL | 0 refills | Status: DC

## 2022-01-08 MED ORDER — METHYLPHENIDATE HCL 20 MG PO TABS
10.0000 mg | ORAL_TABLET | Freq: Every day | ORAL | 0 refills | Status: DC
  Filled 2022-01-08 – 2022-01-19 (×2): qty 30, 30d supply, fill #0

## 2022-01-08 NOTE — Progress Notes (Signed)

## 2022-01-10 ENCOUNTER — Other Ambulatory Visit (HOSPITAL_COMMUNITY): Payer: Self-pay

## 2022-01-10 ENCOUNTER — Encounter (HOSPITAL_BASED_OUTPATIENT_CLINIC_OR_DEPARTMENT_OTHER): Payer: Self-pay | Admitting: Physical Therapy

## 2022-01-10 ENCOUNTER — Encounter (HOSPITAL_BASED_OUTPATIENT_CLINIC_OR_DEPARTMENT_OTHER): Payer: No Typology Code available for payment source | Admitting: Orthopaedic Surgery

## 2022-01-18 ENCOUNTER — Encounter (HOSPITAL_BASED_OUTPATIENT_CLINIC_OR_DEPARTMENT_OTHER): Payer: Self-pay | Admitting: Physical Therapy

## 2022-01-19 ENCOUNTER — Other Ambulatory Visit (HOSPITAL_COMMUNITY): Payer: Self-pay

## 2022-01-23 ENCOUNTER — Telehealth: Payer: No Typology Code available for payment source | Admitting: Physician Assistant

## 2022-01-23 DIAGNOSIS — H109 Unspecified conjunctivitis: Secondary | ICD-10-CM | POA: Diagnosis not present

## 2022-01-23 MED ORDER — POLYMYXIN B-TRIMETHOPRIM 10000-0.1 UNIT/ML-% OP SOLN: 1.0000 [drp] | OPHTHALMIC | 0 refills | Status: DC

## 2022-01-23 NOTE — Progress Notes (Signed)

## 2022-01-24 ENCOUNTER — Ambulatory Visit (INDEPENDENT_AMBULATORY_CARE_PROVIDER_SITE_OTHER): Payer: No Typology Code available for payment source | Admitting: Behavioral Health

## 2022-01-24 DIAGNOSIS — F418 Other specified anxiety disorders: Secondary | ICD-10-CM

## 2022-01-24 NOTE — Progress Notes (Signed)
                Alaysiah Browder L Shykeria Sakamoto, LMFT 

## 2022-01-24 NOTE — Progress Notes (Addendum)
[facility] Counselor/Therapist Progress Note  Patient ID: Wendy Perez, MRN: [id number],    Date: 01/24/2022  Time Spent: 55 min In Person @ [facility]   Treatment Type: Individual Therapy  Reported Symptoms: Elevated anx/dep due to several recent events w/her Parents & the death & MI of two close friends & their parents.  Mental Status Exam: Appearance:  Casual     Behavior: Appropriate and Sharing  Motor: Normal  Speech/Language:  Clear and Coherent and Normal Rate w/loud vol  Affect: Appropriate  Mood: anxious  Thought process: normal  Thought content:   WNL  Sensory/Perceptual disturbances:   WNL  Orientation: oriented to person, place, and time/date  Attention: Good  Concentration: Good  Memory: WNL  Fund of knowledge:  Good  Insight:   Good  Judgment:  Good  Impulse Control:  Fair   Risk Assessment: Danger to Self:  No Self-injurious Behavior: No Danger to Others: No Duty to Warn:no Physical Aggression / Violence:No  Access to Firearms a concern: No  Gang Involvement:No   Subjective: Pt describes a recent argument w/her Dad that has now been mended & healed. This promoted Pt's understanding of her Dad in a different way.  Pt has been caring for two of her good friends; one whose Dad died & the other has a Parent who exp'd a stroke. Pt is learning about herself from her relationships, also @ work where ppl depend on her flexibility.   Interventions: Solution-Oriented/Positive Psychology, Psycho-education/Bibliotherapy, and support for Recovery/Relapse Prevention . Conflict communication skills & transfer of flexibility skills to stressful situations.   Resource: Wendy Perez, [place] & her program/book/workbook: 'Not Drinking tonight'. Insta Acct: @ [facility]  Diagnosis:Anxiety with depression  Plan: Wendy Perez related a story about finding a half empty rum bottle in her cabinet. She did not hesitate to pour it out. She purchased above  resource in session & will start reading the book/workbook to see if this suits her need for addt'l support towards Recovery/Relapse Prevention efforts.  Target Date: 02/24/2022  Progress: 1  Frequency: Twice monthly   Modality: Claretta Fraise, LMFT

## 2022-01-26 ENCOUNTER — Other Ambulatory Visit (HOSPITAL_BASED_OUTPATIENT_CLINIC_OR_DEPARTMENT_OTHER): Payer: Self-pay

## 2022-01-26 MED ORDER — PREDNISONE 5 MG PO TABS
ORAL_TABLET | ORAL | 0 refills | Status: DC
  Filled 2022-01-26 – 2022-07-03 (×2): qty 48, 12d supply, fill #0

## 2022-01-26 MED ORDER — DICLOFENAC SODIUM 75 MG PO TBEC
75.0000 mg | DELAYED_RELEASE_TABLET | Freq: Two times a day (BID) | ORAL | 1 refills | Status: DC
  Filled 2022-01-26 – 2022-02-21 (×2): qty 60, 30d supply, fill #0
  Filled 2022-04-18: qty 60, 30d supply, fill #1

## 2022-01-26 MED ORDER — DICLOFENAC SODIUM 75 MG PO TBEC
75.0000 mg | DELAYED_RELEASE_TABLET | Freq: Two times a day (BID) | ORAL | 2 refills | Status: DC
  Filled 2022-01-26: qty 60, 30d supply, fill #0

## 2022-02-04 ENCOUNTER — Other Ambulatory Visit (HOSPITAL_BASED_OUTPATIENT_CLINIC_OR_DEPARTMENT_OTHER): Payer: Self-pay

## 2022-02-11 ENCOUNTER — Ambulatory Visit: Payer: No Typology Code available for payment source | Admitting: Behavioral Health

## 2022-02-17 DIAGNOSIS — Z30431 Encounter for routine checking of intrauterine contraceptive device: Secondary | ICD-10-CM | POA: Diagnosis not present

## 2022-02-17 DIAGNOSIS — M5416 Radiculopathy, lumbar region: Secondary | ICD-10-CM | POA: Diagnosis not present

## 2022-02-21 ENCOUNTER — Other Ambulatory Visit (HOSPITAL_COMMUNITY): Payer: Self-pay

## 2022-02-21 ENCOUNTER — Other Ambulatory Visit: Payer: Self-pay

## 2022-02-24 DIAGNOSIS — M25551 Pain in right hip: Secondary | ICD-10-CM | POA: Diagnosis not present

## 2022-02-24 DIAGNOSIS — M24159 Other articular cartilage disorders, unspecified hip: Secondary | ICD-10-CM | POA: Diagnosis not present

## 2022-02-24 DIAGNOSIS — M5459 Other low back pain: Secondary | ICD-10-CM | POA: Diagnosis not present

## 2022-02-24 DIAGNOSIS — M48062 Spinal stenosis, lumbar region with neurogenic claudication: Secondary | ICD-10-CM | POA: Diagnosis not present

## 2022-03-03 DIAGNOSIS — M5416 Radiculopathy, lumbar region: Secondary | ICD-10-CM | POA: Diagnosis not present

## 2022-04-18 ENCOUNTER — Other Ambulatory Visit (HOSPITAL_BASED_OUTPATIENT_CLINIC_OR_DEPARTMENT_OTHER): Payer: Self-pay | Admitting: Nurse Practitioner

## 2022-04-18 DIAGNOSIS — F902 Attention-deficit hyperactivity disorder, combined type: Secondary | ICD-10-CM

## 2022-04-18 NOTE — Telephone Encounter (Signed)
Refill request last apt 10/28/21

## 2022-04-19 ENCOUNTER — Other Ambulatory Visit (HOSPITAL_COMMUNITY): Payer: Self-pay

## 2022-04-19 ENCOUNTER — Other Ambulatory Visit: Payer: Self-pay

## 2022-04-21 ENCOUNTER — Other Ambulatory Visit (HOSPITAL_COMMUNITY): Payer: Self-pay

## 2022-04-21 MED ORDER — METHYLPHENIDATE HCL 20 MG PO TABS
10.0000 mg | ORAL_TABLET | Freq: Every day | ORAL | 0 refills | Status: DC
  Filled 2022-04-21: qty 30, 30d supply, fill #0

## 2022-04-22 ENCOUNTER — Other Ambulatory Visit (HOSPITAL_COMMUNITY): Payer: Self-pay

## 2022-05-05 ENCOUNTER — Other Ambulatory Visit: Payer: Self-pay

## 2022-05-26 ENCOUNTER — Other Ambulatory Visit: Payer: Self-pay

## 2022-05-26 ENCOUNTER — Other Ambulatory Visit (HOSPITAL_BASED_OUTPATIENT_CLINIC_OR_DEPARTMENT_OTHER): Payer: Self-pay | Admitting: Nurse Practitioner

## 2022-05-26 DIAGNOSIS — F902 Attention-deficit hyperactivity disorder, combined type: Secondary | ICD-10-CM

## 2022-05-26 NOTE — Telephone Encounter (Signed)
Refill request last apt 10/28/21 

## 2022-05-31 ENCOUNTER — Telehealth: Payer: Self-pay | Admitting: Family Medicine

## 2022-05-31 ENCOUNTER — Other Ambulatory Visit (HOSPITAL_COMMUNITY): Payer: Self-pay

## 2022-05-31 DIAGNOSIS — T3695XA Adverse effect of unspecified systemic antibiotic, initial encounter: Secondary | ICD-10-CM

## 2022-05-31 DIAGNOSIS — B379 Candidiasis, unspecified: Secondary | ICD-10-CM

## 2022-05-31 MED ORDER — FLUCONAZOLE 150 MG PO TABS
150.0000 mg | ORAL_TABLET | ORAL | 0 refills | Status: DC
  Filled 2022-05-31: qty 2, 2d supply, fill #0

## 2022-05-31 NOTE — Progress Notes (Signed)

## 2022-06-01 ENCOUNTER — Other Ambulatory Visit (HOSPITAL_COMMUNITY): Payer: Self-pay

## 2022-06-01 MED ORDER — METHYLPHENIDATE HCL 20 MG PO TABS
10.0000 mg | ORAL_TABLET | Freq: Every day | ORAL | 0 refills | Status: DC
  Filled 2022-06-01 – 2022-09-06 (×4): qty 30, 30d supply, fill #0

## 2022-06-14 ENCOUNTER — Other Ambulatory Visit (HOSPITAL_COMMUNITY): Payer: Self-pay

## 2022-07-04 ENCOUNTER — Other Ambulatory Visit: Payer: Self-pay

## 2022-07-04 ENCOUNTER — Other Ambulatory Visit (HOSPITAL_COMMUNITY): Payer: Self-pay

## 2022-07-05 ENCOUNTER — Other Ambulatory Visit (HOSPITAL_COMMUNITY): Payer: Self-pay

## 2022-07-05 ENCOUNTER — Other Ambulatory Visit: Payer: Self-pay

## 2022-07-06 ENCOUNTER — Encounter: Payer: Self-pay | Admitting: Pharmacist

## 2022-07-06 ENCOUNTER — Other Ambulatory Visit: Payer: Self-pay

## 2022-07-06 ENCOUNTER — Other Ambulatory Visit (HOSPITAL_COMMUNITY): Payer: Self-pay

## 2022-07-21 ENCOUNTER — Other Ambulatory Visit (HOSPITAL_COMMUNITY): Payer: Self-pay

## 2022-08-05 ENCOUNTER — Other Ambulatory Visit (HOSPITAL_COMMUNITY): Payer: Self-pay

## 2022-09-06 ENCOUNTER — Other Ambulatory Visit: Payer: Self-pay

## 2022-09-06 ENCOUNTER — Other Ambulatory Visit (HOSPITAL_BASED_OUTPATIENT_CLINIC_OR_DEPARTMENT_OTHER): Payer: Self-pay

## 2022-10-10 ENCOUNTER — Other Ambulatory Visit (HOSPITAL_BASED_OUTPATIENT_CLINIC_OR_DEPARTMENT_OTHER): Payer: Self-pay | Admitting: Nurse Practitioner

## 2022-10-10 ENCOUNTER — Other Ambulatory Visit (HOSPITAL_BASED_OUTPATIENT_CLINIC_OR_DEPARTMENT_OTHER): Payer: Self-pay

## 2022-10-10 DIAGNOSIS — F902 Attention-deficit hyperactivity disorder, combined type: Secondary | ICD-10-CM

## 2022-10-10 MED ORDER — METHYLPHENIDATE HCL 20 MG PO TABS
10.0000 mg | ORAL_TABLET | Freq: Every day | ORAL | 0 refills | Status: DC
  Filled 2022-10-10: qty 30, 30d supply, fill #0

## 2022-10-10 NOTE — Telephone Encounter (Signed)
I tried to deny this because she has not been in since last sept. Pt. Needs apt.

## 2022-10-21 ENCOUNTER — Other Ambulatory Visit (HOSPITAL_BASED_OUTPATIENT_CLINIC_OR_DEPARTMENT_OTHER): Payer: Self-pay

## 2022-11-15 ENCOUNTER — Other Ambulatory Visit (HOSPITAL_BASED_OUTPATIENT_CLINIC_OR_DEPARTMENT_OTHER): Payer: Self-pay | Admitting: Nurse Practitioner

## 2022-11-15 ENCOUNTER — Telehealth: Payer: Self-pay | Admitting: Internal Medicine

## 2022-11-15 ENCOUNTER — Other Ambulatory Visit (HOSPITAL_BASED_OUTPATIENT_CLINIC_OR_DEPARTMENT_OTHER): Payer: Self-pay

## 2022-11-15 DIAGNOSIS — F902 Attention-deficit hyperactivity disorder, combined type: Secondary | ICD-10-CM

## 2022-11-15 DIAGNOSIS — F339 Major depressive disorder, recurrent, unspecified: Secondary | ICD-10-CM

## 2022-11-15 MED ORDER — ESCITALOPRAM OXALATE 20 MG PO TABS
20.0000 mg | ORAL_TABLET | Freq: Every day | ORAL | 0 refills | Status: DC
Start: 2022-11-15 — End: 2022-12-19

## 2022-11-15 MED ORDER — BUPROPION HCL ER (SR) 150 MG PO TB12
150.0000 mg | ORAL_TABLET | Freq: Two times a day (BID) | ORAL | 0 refills | Status: DC
Start: 2022-11-15 — End: 2022-12-19

## 2022-11-15 MED ORDER — METHYLPHENIDATE HCL 20 MG PO TABS
10.0000 mg | ORAL_TABLET | Freq: Every day | ORAL | 0 refills | Status: DC
Start: 2022-11-15 — End: 2022-11-17
  Filled 2022-11-15: qty 30, 30d supply, fill #0

## 2022-11-15 NOTE — Telephone Encounter (Signed)
Pt called and left a message to schedule a visit. She is scheduled for November 4th but needed a 90 day supply for her lexapro and wellbutrin to pharmacy. So I sent this to pharmacy  Pt would like a refill on her ritalin. She is unable to take concerta due to feeling like a brain fog

## 2022-11-15 NOTE — Telephone Encounter (Signed)
Last apt over a year ago 10/28/21 pt. Was sent MyChart message last month that she needs to schedule an apt.

## 2022-11-16 ENCOUNTER — Other Ambulatory Visit (HOSPITAL_BASED_OUTPATIENT_CLINIC_OR_DEPARTMENT_OTHER): Payer: Self-pay

## 2022-11-16 ENCOUNTER — Other Ambulatory Visit: Payer: Self-pay

## 2022-11-17 ENCOUNTER — Other Ambulatory Visit (HOSPITAL_BASED_OUTPATIENT_CLINIC_OR_DEPARTMENT_OTHER): Payer: Self-pay

## 2022-11-17 MED ORDER — METHYLPHENIDATE HCL 20 MG PO TABS
20.0000 mg | ORAL_TABLET | Freq: Two times a day (BID) | ORAL | 0 refills | Status: DC
Start: 2022-11-17 — End: 2022-12-19

## 2022-12-16 ENCOUNTER — Other Ambulatory Visit (HOSPITAL_COMMUNITY): Payer: Self-pay

## 2022-12-19 ENCOUNTER — Encounter: Payer: Self-pay | Admitting: Nurse Practitioner

## 2022-12-19 ENCOUNTER — Ambulatory Visit: Payer: PRIVATE HEALTH INSURANCE | Admitting: Nurse Practitioner

## 2022-12-19 VITALS — BP 126/82 | HR 56 | Wt 127.4 lb

## 2022-12-19 DIAGNOSIS — F339 Major depressive disorder, recurrent, unspecified: Secondary | ICD-10-CM | POA: Diagnosis not present

## 2022-12-19 DIAGNOSIS — F902 Attention-deficit hyperactivity disorder, combined type: Secondary | ICD-10-CM | POA: Diagnosis not present

## 2022-12-19 MED ORDER — METHYLPHENIDATE HCL 20 MG PO TABS: 20.0000 mg | ORAL_TABLET | Freq: Every day | ORAL | 0 refills | Status: DC

## 2022-12-19 MED ORDER — BUPROPION HCL ER (SR) 150 MG PO TB12: 150.0000 mg | ORAL_TABLET | Freq: Two times a day (BID) | ORAL | 3 refills | Status: DC

## 2022-12-19 MED ORDER — LISDEXAMFETAMINE DIMESYLATE 30 MG PO CAPS: 30.0000 mg | ORAL_CAPSULE | Freq: Every day | ORAL | 0 refills | Status: DC

## 2022-12-19 MED ORDER — ESCITALOPRAM OXALATE 20 MG PO TABS: 20.0000 mg | ORAL_TABLET | Freq: Every day | ORAL | 3 refills | Status: DC

## 2022-12-19 NOTE — Patient Instructions (Signed)
Please let me know if the vyvanse is working and I will send in refills for you

## 2022-12-19 NOTE — Progress Notes (Signed)
Wendy Clamp, DNP, AGNP-c [facility]  [address] [place], [place] [address] [phone]  ESTABLISHED PATIENT- Chronic Health and/or Follow-Up Visit  Blood pressure 126/82, pulse (!) 56, weight 127 lb 6.4 oz (57.8 kg), last menstrual period 12/06/2022, unknown if currently breastfeeding. Not breastfeeding   Wendy Perez is a 42 y.o. year old female presenting today for evaluation and management of chronic conditions.  ADHD and Mood  Wendy Perez is a 42 year old female with a history of ADHD, depression, and anxiety.  She presents today with concerns about side effects of Concerta.  She describes the medication causing a "brain fog" and" out of body" sensation.  She reports that the symptoms were so severe that she would forget what she was saying midsentence.  This is led to self discontinuation of the medication.  She has been taking short acting Ritalin intermittently to help her manage but she is interesting in trying a different extended release medication for ADHD.  In addition to the cognitive changes, Dalani has recently undergone significant life changes including a job transition from [employer] to [employer]  This transition has been stressful with feelings of loss of familiarity and missing the friendships and relationships that she had established at her previous job.  She also discusses challenges with her teenage daughter contributing to her overall emotional burden.  Despite these challenges, she reports that the move was good for her.  At this time she feels the combination of Lexapro and Wellbutrin have been effective in managing her mood and she would like to continue this regimen.  All ROS negative with exception of what is listed above.   PHYSICAL EXAM Physical Exam Vitals and nursing note reviewed.  Constitutional:      Appearance: Normal appearance.  HENT:     Head: Normocephalic.  Eyes:     Pupils: Pupils are equal, round, and reactive to  light.  Cardiovascular:     Rate and Rhythm: Normal rate and regular rhythm.     Pulses: Normal pulses.     Heart sounds: Normal heart sounds.  Pulmonary:     Effort: Pulmonary effort is normal.     Breath sounds: Normal breath sounds.  Musculoskeletal:        General: Normal range of motion.     Cervical back: Normal range of motion.  Skin:    General: Skin is warm.  Neurological:     General: No focal deficit present.     Mental Status: She is alert and oriented to person, place, and time.  Psychiatric:        Mood and Affect: Mood normal.      PLAN Problem List Items Addressed This Visit     Attention deficit hyperactivity disorder (ADHD), combined type - Primary    Chronic ADHD with limited success with use of Concerta due to brain fog.  Ritalin intermittent leave the and in the afternoon has shown some benefit.  We discussed options for long-acting medications.  We will plan to start Vyvanse and monitor closely. - Start Vyvanse.  Monitor for efficacy and side effects and report if unable to tolerate this medicine. - Continue Ritalin as needed for short-term focus      Relevant Medications   lisdexamfetamine (VYVANSE) 30 MG capsule   methylphenidate (RITALIN) 20 MG tablet   Depression, recurrent (HCC)    Significant life stressors including a job change and personal issues with raising children.  She is currently on Lexapro and Wellbutrin with good response however  she is still experiencing increased periods of tearfulness.  We discussed increasing the Wellbutrin due to the current stressors.  She feels that this may be beneficial. - Increase Wellbutrin to 300 mg daily divided into morning and afternoon doses. - Continue Lexapro. - Please let me know increased medication does not feel as though it is helpful for improvement of her symptoms.      Relevant Medications   escitalopram (LEXAPRO) 20 MG tablet   buPROPion (WELLBUTRIN SR) 150 MG 12 hr tablet    Return for  CPE- after 43.  Wendy Clamp, DNP, AGNP-c

## 2022-12-19 NOTE — Assessment & Plan Note (Signed)
Chronic ADHD with limited success with use of Concerta due to brain fog.  Ritalin intermittent leave the and in the afternoon has shown some benefit.  We discussed options for long-acting medications.  We will plan to start Vyvanse and monitor closely. - Start Vyvanse.  Monitor for efficacy and side effects and report if unable to tolerate this medicine. - Continue Ritalin as needed for short-term focus

## 2022-12-19 NOTE — Assessment & Plan Note (Signed)
Significant life stressors including a job change and personal issues with raising children.  She is currently on Lexapro and Wellbutrin with good response however she is still experiencing increased periods of tearfulness.  We discussed increasing the Wellbutrin due to the current stressors.  She feels that this may be beneficial. - Increase Wellbutrin to 300 mg daily divided into morning and afternoon doses. - Continue Lexapro. - Please let me know increased medication does not feel as though it is helpful for improvement of her symptoms.

## 2023-02-27 ENCOUNTER — Encounter: Payer: Self-pay | Admitting: Nurse Practitioner

## 2023-02-27 DIAGNOSIS — F902 Attention-deficit hyperactivity disorder, combined type: Secondary | ICD-10-CM

## 2023-02-28 MED ORDER — AMPHETAMINE-DEXTROAMPHET ER 10 MG PO CP24: 10.0000 mg | ORAL_CAPSULE | ORAL | 0 refills | Status: DC

## 2023-03-01 ENCOUNTER — Other Ambulatory Visit: Payer: Self-pay | Admitting: Nurse Practitioner

## 2023-03-01 DIAGNOSIS — F902 Attention-deficit hyperactivity disorder, combined type: Secondary | ICD-10-CM

## 2023-03-02 NOTE — Telephone Encounter (Signed)
Last apt 12/19/22.

## 2023-03-09 ENCOUNTER — Encounter: Payer: PRIVATE HEALTH INSURANCE | Admitting: Nurse Practitioner

## 2023-04-10 ENCOUNTER — Encounter: Payer: Self-pay | Admitting: Nurse Practitioner

## 2023-04-10 ENCOUNTER — Encounter: Payer: PRIVATE HEALTH INSURANCE | Admitting: Nurse Practitioner

## 2023-05-08 NOTE — Progress Notes (Unsigned)
 Covid +

## 2023-05-09 ENCOUNTER — Ambulatory Visit: Payer: PRIVATE HEALTH INSURANCE | Admitting: Nurse Practitioner

## 2023-05-09 ENCOUNTER — Encounter: Payer: Self-pay | Admitting: Nurse Practitioner

## 2023-05-09 VITALS — BP 112/78 | HR 64 | Ht 62.0 in | Wt 123.6 lb

## 2023-05-09 DIAGNOSIS — F339 Major depressive disorder, recurrent, unspecified: Secondary | ICD-10-CM | POA: Diagnosis not present

## 2023-05-09 DIAGNOSIS — N3281 Overactive bladder: Secondary | ICD-10-CM | POA: Insufficient documentation

## 2023-05-09 DIAGNOSIS — Z130 Encounter for screening for diseases of the blood and blood-forming organs and certain disorders involving the immune mechanism: Secondary | ICD-10-CM

## 2023-05-09 DIAGNOSIS — Z1321 Encounter for screening for nutritional disorder: Secondary | ICD-10-CM

## 2023-05-09 DIAGNOSIS — Z0000 Encounter for general adult medical examination without abnormal findings: Secondary | ICD-10-CM | POA: Diagnosis not present

## 2023-05-09 DIAGNOSIS — Z13228 Encounter for screening for other metabolic disorders: Secondary | ICD-10-CM

## 2023-05-09 DIAGNOSIS — E538 Deficiency of other specified B group vitamins: Secondary | ICD-10-CM | POA: Insufficient documentation

## 2023-05-09 DIAGNOSIS — F902 Attention-deficit hyperactivity disorder, combined type: Secondary | ICD-10-CM | POA: Diagnosis not present

## 2023-05-09 DIAGNOSIS — E559 Vitamin D deficiency, unspecified: Secondary | ICD-10-CM | POA: Insufficient documentation

## 2023-05-09 DIAGNOSIS — N951 Menopausal and female climacteric states: Secondary | ICD-10-CM | POA: Insufficient documentation

## 2023-05-09 DIAGNOSIS — Z1329 Encounter for screening for other suspected endocrine disorder: Secondary | ICD-10-CM

## 2023-05-09 LAB — LIPID PANEL

## 2023-05-09 MED ORDER — AMPHETAMINE-DEXTROAMPHET ER 10 MG PO CP24: 10.0000 mg | ORAL_CAPSULE | ORAL | 0 refills | Status: AC

## 2023-05-09 MED ORDER — METHYLPHENIDATE HCL 20 MG PO TABS: 20.0000 mg | ORAL_TABLET | Freq: Every day | ORAL | 0 refills | Status: DC

## 2023-05-09 MED ORDER — AMPHETAMINE-DEXTROAMPHET ER 10 MG PO CP24: 10.0000 mg | ORAL_CAPSULE | ORAL | 0 refills | Status: DC

## 2023-05-09 MED ORDER — OXYBUTYNIN CHLORIDE ER 5 MG PO TB24: 5.0000 mg | ORAL_TABLET | Freq: Every day | ORAL | 11 refills | Status: AC

## 2023-05-09 NOTE — Assessment & Plan Note (Signed)

## 2023-05-09 NOTE — Assessment & Plan Note (Signed)
 Experiencing perimenopausal symptoms, including hot flashes, night sweats, irregular menstrual cycles, mood fluctuations, and emotional lability, likely due to hormonal changes. Familial pattern suggested by mother's Wendy Perez menopause. Positive response to increased Wellbutrin dosage for mood symptoms. Discussed potential role of Mirena IUD for symptom management, though effectiveness may vary. - Continue Wellbutrin 300 mg daily - Consider further adjustment of Wellbutrin dosage if symptoms persist

## 2023-05-09 NOTE — Assessment & Plan Note (Signed)
 Experiencing depressive symptoms, including low energy, mood swings, and difficulty with motivation. Wellbutrin SR 300 mg daily is beneficial. Discussed potential dosage adjustment if needed and emphasized consistent medication adherence for stability. We can consider transition to the XR version if the new dose is ineffective.  - Continue Wellbutrin SR 300 mg daily - Monitor mood and energy levels - Adjust Wellbutrin dosage if necessary

## 2023-05-09 NOTE — Assessment & Plan Note (Signed)
 Experiencing urgency and occasional incontinence. Previously managed with Myrbetriq, discontinued due to cost. Discussed alternative treatments, including oxybutynin and pelvic floor physical therapy. Oxybutynin may cause dryness; advised increased water intake. Pelvic floor therapy could significantly improve symptoms, especially in younger patients. - Prescribe oxybutynin 30-day supply - Consider referral for pelvic floor physical therapy - Monitor response to oxybutynin

## 2023-05-09 NOTE — Patient Instructions (Signed)
 VISIT SUMMARY:  Today, we conducted your annual physical exam. We discussed your symptoms of hot flashes, night sweats, irregular menstrual cycles, mood fluctuations, and fatigue. We also reviewed your history of overactive bladder and your current medications. We have developed a plan to address each of these concerns and ensure your overall health and well-being.  YOUR PLAN:  -PERIMENOPAUSAL SYMPTOMS: Perimenopause is the transition period before menopause when hormonal changes can cause symptoms like hot flashes, night sweats, and irregular periods. We will continue your current dose of Wellbutrin at 300 mg daily and consider adjusting the dosage if your symptoms persist. We also discussed the potential use of a Mirena IUD to help manage your symptoms, though its effectiveness may vary.  -DEPRESSIVE SYMPTOMS: Depressive symptoms can include low energy, mood swings, and difficulty with motivation. Your current dose of Wellbutrin at 300 mg daily is beneficial. We will continue this dosage, monitor your mood and energy levels, and adjust the dosage if necessary to ensure stability.  -OVERACTIVE BLADDER: Overactive bladder is a condition characterized by a sudden urge to urinate and occasional incontinence. We will prescribe a 30-day supply of oxybutynin and consider referring you for pelvic floor physical therapy, which can significantly improve symptoms. Please increase your water intake to counteract any dryness caused by the medication.  -GENERAL HEALTH MAINTENANCE: We will conduct comprehensive laboratory tests, including a complete blood count (CBC), comprehensive metabolic panel (CMP), lipid panel, thyroid function tests, and levels of B12 and vitamin D, to assess your overall health and address your fatigue-related concerns.  INSTRUCTIONS:  Please continue taking Wellbutrin 300 mg daily and monitor your mood and energy levels. Start taking the prescribed oxybutynin and increase your water  intake. We will consider a referral for pelvic floor physical therapy based on your response to the medication. Additionally, we will conduct comprehensive laboratory tests to assess your overall health. Follow up with [place] to review the results and discuss any necessary adjustments to your treatment plan.

## 2023-05-09 NOTE — Assessment & Plan Note (Signed)
 Doing well on adderall 10mg  every morning and ritalin 20mg  in the afternoon. No alarm symptoms present. She is able to manage her work schedule with these doses. Will plan to continue. PDMP reviewed and 3 months refills sent.

## 2023-05-10 LAB — VITAMIN B12: Vitamin B-12: 606 pg/mL (ref 232–1245)

## 2023-05-10 LAB — CMP14+EGFR
ALT: 15 IU/L (ref 0–32)
AST: 20 IU/L (ref 0–40)
Albumin: 4.6 g/dL (ref 3.9–4.9)
Alkaline Phosphatase: 47 IU/L (ref 44–121)
BUN/Creatinine Ratio: 10 (ref 9–23)
BUN: 9 mg/dL (ref 6–24)
Bilirubin Total: 0.5 mg/dL (ref 0.0–1.2)
CO2: 25 mmol/L (ref 20–29)
Calcium: 9.2 mg/dL (ref 8.7–10.2)
Chloride: 103 mmol/L (ref 96–106)
Creatinine, Ser: 0.88 mg/dL (ref 0.57–1.00)
Globulin, Total: 1.7 g/dL (ref 1.5–4.5)
Glucose: 74 mg/dL (ref 70–99)
Potassium: 4.2 mmol/L (ref 3.5–5.2)
Sodium: 141 mmol/L (ref 134–144)
Total Protein: 6.3 g/dL (ref 6.0–8.5)
eGFR: 83 mL/min/{1.73_m2} (ref >59)

## 2023-05-10 LAB — CBC WITH DIFFERENTIAL/PLATELET
Basophils Absolute: 0.1 10*3/uL (ref 0.0–0.2)
Basos: 1 %
EOS (ABSOLUTE): 0.1 10*3/uL (ref 0.0–0.4)
Eos: 1 %
Hematocrit: 38.7 % (ref 34.0–46.6)
Hemoglobin: 12.8 g/dL (ref 11.1–15.9)
Immature Grans (Abs): 0 10*3/uL (ref 0.0–0.1)
Immature Granulocytes: 0 %
Lymphocytes Absolute: 1.5 10*3/uL (ref 0.7–3.1)
Lymphs: 31 %
MCH: 30.8 pg (ref 26.6–33.0)
MCHC: 33.1 g/dL (ref 31.5–35.7)
MCV: 93 fL (ref 79–97)
Monocytes Absolute: 0.3 10*3/uL (ref 0.1–0.9)
Monocytes: 5 %
Neutrophils Absolute: 2.9 10*3/uL (ref 1.4–7.0)
Neutrophils: 62 %
Platelets: 404 10*3/uL (ref 150–450)
RBC: 4.15 x10E6/uL (ref 3.77–5.28)
RDW: 12.5 % (ref 11.7–15.4)
WBC: 4.8 10*3/uL (ref 3.4–10.8)

## 2023-05-10 LAB — LIPID PANEL
Cholesterol, Total: 177 mg/dL (ref 100–199)
HDL: 97 mg/dL (ref >39)
LDL CALC COMMENT:: 1.8 ratio (ref 0.0–4.4)
LDL Chol Calc (NIH): 67 mg/dL (ref 0–99)
Triglycerides: 70 mg/dL (ref 0–149)
VLDL Cholesterol Cal: 13 mg/dL (ref 5–40)

## 2023-05-10 LAB — VITAMIN D 25 HYDROXY (VIT D DEFICIENCY, FRACTURES): Vit D, 25-Hydroxy: 34.7 ng/mL (ref 30.0–100.0)

## 2023-05-10 LAB — TSH: TSH: 1.71 u[IU]/mL (ref 0.450–4.500)

## 2023-05-10 LAB — HEMOGLOBIN A1C
Est. average glucose Bld gHb Est-mCnc: 103 mg/dL
Hgb A1c MFr Bld: 5.2 % (ref 4.8–5.6)

## 2023-05-24 ENCOUNTER — Encounter: Payer: Self-pay | Admitting: Nurse Practitioner

## 2023-06-28 ENCOUNTER — Other Ambulatory Visit: Payer: Self-pay | Admitting: Nurse Practitioner

## 2023-06-28 DIAGNOSIS — F902 Attention-deficit hyperactivity disorder, combined type: Secondary | ICD-10-CM

## 2023-06-28 NOTE — Telephone Encounter (Signed)
   This was sent in already by Sarabeth to start on 07/04/23

## 2023-07-18 ENCOUNTER — Encounter: Payer: PRIVATE HEALTH INSURANCE | Admitting: Nurse Practitioner

## 2023-08-24 ENCOUNTER — Other Ambulatory Visit: Payer: Self-pay | Admitting: Nurse Practitioner

## 2023-08-24 DIAGNOSIS — F902 Attention-deficit hyperactivity disorder, combined type: Secondary | ICD-10-CM

## 2023-08-24 NOTE — Telephone Encounter (Signed)
 Last apt 05/09/23

## 2023-10-11 ENCOUNTER — Other Ambulatory Visit: Payer: Self-pay | Admitting: Nurse Practitioner

## 2023-10-11 DIAGNOSIS — F902 Attention-deficit hyperactivity disorder, combined type: Secondary | ICD-10-CM

## 2023-10-12 MED ORDER — METHYLPHENIDATE HCL 20 MG PO TABS: 20.0000 mg | ORAL_TABLET | Freq: Every day | ORAL | 0 refills | Status: AC

## 2024-01-04 ENCOUNTER — Other Ambulatory Visit: Payer: Self-pay | Admitting: Nurse Practitioner

## 2024-01-04 DIAGNOSIS — F339 Major depressive disorder, recurrent, unspecified: Secondary | ICD-10-CM

## 2024-01-04 DIAGNOSIS — F902 Attention-deficit hyperactivity disorder, combined type: Secondary | ICD-10-CM

## 2024-01-04 NOTE — Telephone Encounter (Signed)
 Last apt 05/09/23

## 2024-03-04 ENCOUNTER — Other Ambulatory Visit: Payer: Self-pay | Admitting: Nurse Practitioner

## 2024-03-04 DIAGNOSIS — F339 Major depressive disorder, recurrent, unspecified: Secondary | ICD-10-CM

## 2024-05-13 ENCOUNTER — Encounter: Payer: PRIVATE HEALTH INSURANCE | Admitting: Nurse Practitioner
